# Patient Record
Sex: Male | Born: 1985 | Race: White | Hispanic: No | Marital: Single | Smoking: Former smoker
Health system: Southern US, Community
[De-identification: ages and names within clinical notes are randomized; demographics above are authoritative.]

## PROBLEM LIST (undated history)

## (undated) DIAGNOSIS — J45909 Unspecified asthma, uncomplicated: Secondary | ICD-10-CM

## (undated) DIAGNOSIS — K759 Inflammatory liver disease, unspecified: Secondary | ICD-10-CM

## (undated) DIAGNOSIS — F1990 Other psychoactive substance use, unspecified, uncomplicated: Secondary | ICD-10-CM

## (undated) DIAGNOSIS — F329 Major depressive disorder, single episode, unspecified: Secondary | ICD-10-CM

## (undated) DIAGNOSIS — F1910 Other psychoactive substance abuse, uncomplicated: Secondary | ICD-10-CM

## (undated) HISTORY — DX: Inflammatory liver disease, unspecified: K75.9

---
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
## 2006-06-20 ENCOUNTER — Emergency Department (HOSPITAL_COMMUNITY): Admission: EM | Admit: 2006-06-20 | Discharge: 2006-06-20 | Payer: Self-pay | Admitting: Emergency Medicine

## 2007-03-02 ENCOUNTER — Emergency Department (HOSPITAL_COMMUNITY): Admission: EM | Admit: 2007-03-02 | Discharge: 2007-03-02 | Payer: Self-pay | Admitting: Emergency Medicine

## 2007-03-24 ENCOUNTER — Emergency Department (HOSPITAL_COMMUNITY): Admission: EM | Admit: 2007-03-24 | Discharge: 2007-03-24 | Payer: Self-pay | Admitting: Emergency Medicine

## 2007-04-28 ENCOUNTER — Emergency Department (HOSPITAL_COMMUNITY): Admission: EM | Admit: 2007-04-28 | Discharge: 2007-04-28 | Payer: Self-pay | Admitting: Emergency Medicine

## 2008-06-13 ENCOUNTER — Encounter: Payer: Self-pay | Admitting: Pulmonary Disease

## 2008-08-22 ENCOUNTER — Ambulatory Visit: Payer: Self-pay | Admitting: Pulmonary Disease

## 2008-08-22 DIAGNOSIS — F17200 Nicotine dependence, unspecified, uncomplicated: Secondary | ICD-10-CM | POA: Insufficient documentation

## 2008-08-22 DIAGNOSIS — J209 Acute bronchitis, unspecified: Secondary | ICD-10-CM | POA: Insufficient documentation

## 2008-08-22 DIAGNOSIS — J45909 Unspecified asthma, uncomplicated: Secondary | ICD-10-CM | POA: Insufficient documentation

## 2009-04-16 ENCOUNTER — Emergency Department (HOSPITAL_COMMUNITY): Admission: EM | Admit: 2009-04-16 | Discharge: 2009-04-17 | Payer: Self-pay | Admitting: Emergency Medicine

## 2009-04-26 ENCOUNTER — Emergency Department (HOSPITAL_COMMUNITY): Admission: EM | Admit: 2009-04-26 | Discharge: 2009-04-27 | Payer: Self-pay | Admitting: Emergency Medicine

## 2009-10-06 HISTORY — PX: ORIF CLAVICLE FRACTURE: SUR924

## 2009-11-19 ENCOUNTER — Ambulatory Visit: Payer: Self-pay | Admitting: Pulmonary Disease

## 2009-11-19 DIAGNOSIS — J069 Acute upper respiratory infection, unspecified: Secondary | ICD-10-CM | POA: Insufficient documentation

## 2010-03-01 ENCOUNTER — Ambulatory Visit: Payer: Self-pay | Admitting: Psychology

## 2010-03-14 ENCOUNTER — Ambulatory Visit: Payer: Self-pay | Admitting: Psychology

## 2010-03-26 ENCOUNTER — Ambulatory Visit: Payer: Self-pay | Admitting: Psychology

## 2010-11-05 NOTE — Assessment & Plan Note (Signed)
Summary: acute sick visit for prob. URI   CC:  Pt is here for a sick visit.  Pt is Dr. Reginia [name] pt.  Pt was last seen by RA Nov 2009.  Pt c/o sore throat, non-productive cough, difficulty swallowing, and "uncomfortable feeling in chest" and increased sob at rest.  Symptoms started last night.  .  History of Present Illness: the pt is a 25y/o male who comes in today for an acute sick visit.  He has seen Dr. Vassie Loll in the distant past, but not since 2009.  He gives a one day h/o sore throat, dry cough, malaise, and some chest tightness.  He denies fevers, chills, sweats, but does have malaise/myalgias.  He has no documented history of pulmonary disease, but unfortunately does smoke  Medications Prior to Update: 1)  No Daily Medications  Allergies (verified): No Known Drug Allergies  Past History:  Past Medical History:   ? ASTHMA (ICD-493.90) - reported by pt  Past Surgical History: none per pt  Family History: Reviewed history from 08/22/2008 and no changes required. Family History Asthma--sister Family History COPD --father Family History Emphysema--PGF   Social History: Reviewed history from 08/22/2008 and no changes required. Patient is a current smoker.  1 ppd.  Patient admits to prior drug use.  Single  Student  Review of Systems       The patient complains of shortness of breath at rest, non-productive cough, difficulty swallowing, and sore throat.  The patient denies shortness of breath with activity, productive cough, coughing up blood, chest pain, irregular heartbeats, acid heartburn, indigestion, loss of appetite, weight change, abdominal pain, tooth/dental problems, headaches, nasal congestion/difficulty breathing through nose, sneezing, itching, ear ache, anxiety, depression, hand/feet swelling, joint stiffness or pain, rash, change in color of mucus, and fever.    Vital Signs:  Patient profile:   25 year old male Height:      72 inches Weight:      169 pounds BMI:      23.00 O2 Sat:      97 % on Room air Temp:     98.2 degrees F oral Pulse rate:   116 / minute BP sitting:   132 / 84  (right arm) Cuff size:   regular  Vitals Entered By: Arman Filter LPN (November 19, 2009 1:54 PM)  O2 Flow:  Room air CC: Pt is here for a sick visit.  Pt is Dr. Reginia [name] pt.  Pt was last seen by RA Nov 2009.  Pt c/o sore throat, non-productive cough, difficulty swallowing, "uncomfortable feeling in chest" and increased sob at rest.  Symptoms started last night.   Comments Medications reviewed with patient Arman Filter LPN  November 19, 2009 1:54 PM    Physical Exam  General:  wd male in nad Nose:  patent without discharge Mouth:  large tonsils, no exudates or sign of infection Neck:  no palpable LN Lungs:  totally clear to auscultation Heart:  rrr Neurologic:  alert and oriented, moves all 4.   Impression & Recommendations:  Problem # 1:  VIRAL URI (ICD-465.9) the pt's symptoms are most c/w a viral URI.  His presentation is very similar to a lot of the pts we have been seeing lately with acute viral illness that is self limiting.  I have asked him to work on symptom control with otc meds, take advil for throat discomfort.  If he is not improving in the next 48hrs, and certainly if his symptoms are worsening, he is to  call us or go to emergency room.  I have also told him it would help to stop smoking.  Other Orders: Est. Patient Level III (04540)  Patient Instructions: 1)  take advil 200mg  2-3 tabs every 6 hrs if needed for throat pain 2)  try nyquil at night, dayquil during day until better. 3)  lay off the smoking 4)  call us if worsening chest congestion or if coughing up more discolored mucus.

## 2010-12-30 ENCOUNTER — Telehealth: Payer: Self-pay | Admitting: Pulmonary Disease

## 2010-12-30 ENCOUNTER — Telehealth: Payer: Self-pay | Admitting: Internal Medicine
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
## 2010-12-30 NOTE — Telephone Encounter (Signed)
Error - duplicate msg

## 2010-12-30 NOTE — Telephone Encounter (Signed)
Pt father called back and he wanted to know if MW could see his son for his broken collar bone. I advised he could not because the pt is a Dr. Vassie Loll pt for pulmonary issues. I asked if pt had a PCP and he stated he does not. He wanted to know what ortho doctor MW recommends and will he refer the pt. I advised he could not refer him because he is not his patient.  I advised he could call [facility]. Carron Curie, CMA

## 2011-01-01 ENCOUNTER — Encounter (HOSPITAL_COMMUNITY)
Admission: RE | Admit: 2011-01-01 | Discharge: 2011-01-01 | Disposition: A | Discharge status: Home / Self Care | Payer: PRIVATE HEALTH INSURANCE | Source: Ambulatory Visit | Attending: Orthopaedic Surgery | Admitting: Orthopaedic Surgery

## 2011-01-01 LAB — CBC
HCT: 43.6 % (ref 39.0–52.0)
MCV: 88.3 fL (ref 78.0–100.0)
RDW: 14.1 % (ref 11.5–15.5)
WBC: 7.4 10*3/uL (ref 4.0–10.5)

## 2011-01-01 LAB — SURGICAL PCR SCREEN: Staphylococcus aureus: POSITIVE — AB

## 2011-01-02 ENCOUNTER — Observation Stay (HOSPITAL_COMMUNITY)
Admission: RE | Admit: 2011-01-02 | Discharge: 2011-01-03 | Disposition: A | Discharge status: Home / Self Care | Payer: PRIVATE HEALTH INSURANCE | Source: Ambulatory Visit | Attending: Orthopaedic Surgery | Admitting: Orthopaedic Surgery

## 2011-01-02 ENCOUNTER — Ambulatory Visit (HOSPITAL_COMMUNITY): Payer: PRIVATE HEALTH INSURANCE

## 2011-01-02 DIAGNOSIS — Z01818 Encounter for other preprocedural examination: Secondary | ICD-10-CM | POA: Insufficient documentation

## 2011-01-02 DIAGNOSIS — Z01812 Encounter for preprocedural laboratory examination: Secondary | ICD-10-CM | POA: Insufficient documentation

## 2011-01-02 DIAGNOSIS — S42023A Displaced fracture of shaft of unspecified clavicle, initial encounter for closed fracture: Principal | ICD-10-CM | POA: Insufficient documentation

## 2011-01-02 DIAGNOSIS — F17200 Nicotine dependence, unspecified, uncomplicated: Secondary | ICD-10-CM | POA: Insufficient documentation
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
## 2011-01-12 LAB — DIFFERENTIAL
Basophils Absolute: 0 10*3/uL (ref 0.0–0.1)
Eosinophils Absolute: 0.2 10*3/uL (ref 0.0–0.7)
Eosinophils Absolute: 0.3 10*3/uL (ref 0.0–0.7)
Eosinophils Relative: 2 % (ref 0–5)
Lymphocytes Relative: 36 % (ref 12–46)
Lymphs Abs: 2.6 10*3/uL (ref 0.7–4.0)
Monocytes Relative: 7 % (ref 3–12)
Neutrophils Relative %: 54 % (ref 43–77)

## 2011-01-12 LAB — URINALYSIS, ROUTINE W REFLEX MICROSCOPIC
Bilirubin Urine: NEGATIVE
Ketones, ur: NEGATIVE mg/dL
Nitrite: NEGATIVE
Protein, ur: NEGATIVE mg/dL

## 2011-01-12 LAB — BASIC METABOLIC PANEL
BUN: 9 mg/dL (ref 6–23)
CO2: 23 mEq/L (ref 19–32)
Chloride: 109 mEq/L (ref 96–112)
Chloride: 111 mEq/L (ref 96–112)
Creatinine, Ser: 0.98 mg/dL (ref 0.4–1.5)
GFR calc Af Amer: >60 mL/min (ref >60)
GFR calc non Af Amer: >60 mL/min (ref >60)
GFR calc non Af Amer: >60 mL/min (ref >60)
Glucose, Bld: 106 mg/dL — ABNORMAL HIGH (ref 70–99)
Potassium: 3 mEq/L — ABNORMAL LOW (ref 3.5–5.1)
Potassium: 3.8 mEq/L (ref 3.5–5.1)

## 2011-01-12 LAB — RAPID URINE DRUG SCREEN, HOSP PERFORMED
Benzodiazepines: POSITIVE — AB
Cocaine: NOT DETECTED
Opiates: NOT DETECTED
Tetrahydrocannabinol: POSITIVE — AB
Tetrahydrocannabinol: POSITIVE — AB

## 2011-01-12 LAB — CBC
HCT: 42 % (ref 39.0–52.0)
MCHC: 34.1 g/dL (ref 30.0–36.0)
MCV: 90.3 fL (ref 78.0–100.0)
MCV: 90.9 fL (ref 78.0–100.0)
Platelets: 230 10*3/uL (ref 150–400)
Platelets: 250 10*3/uL (ref 150–400)
RBC: 5.01 MIL/uL (ref 4.22–5.81)
RDW: 12.9 % (ref 11.5–15.5)
WBC: 7.4 10*3/uL (ref 4.0–10.5)

## 2011-01-12 LAB — ETHANOL: Alcohol, Ethyl (B): 109 mg/dL — ABNORMAL HIGH (ref 0–10)

## 2011-03-18 ENCOUNTER — Emergency Department (HOSPITAL_COMMUNITY): Payer: PRIVATE HEALTH INSURANCE

## 2011-03-18 ENCOUNTER — Emergency Department (HOSPITAL_COMMUNITY)
Admission: EM | Admit: 2011-03-18 | Discharge: 2011-03-18 | Disposition: A | Discharge status: Home / Self Care | Payer: PRIVATE HEALTH INSURANCE | Attending: Emergency Medicine | Admitting: Emergency Medicine

## 2011-03-18 DIAGNOSIS — F1010 Alcohol abuse, uncomplicated: Secondary | ICD-10-CM | POA: Insufficient documentation

## 2011-03-18 DIAGNOSIS — R4182 Altered mental status, unspecified: Secondary | ICD-10-CM | POA: Insufficient documentation

## 2011-03-18 DIAGNOSIS — T50901A Poisoning by unspecified drugs, medicaments and biological substances, accidental (unintentional), initial encounter: Secondary | ICD-10-CM | POA: Insufficient documentation

## 2011-03-18 DIAGNOSIS — F17200 Nicotine dependence, unspecified, uncomplicated: Secondary | ICD-10-CM | POA: Insufficient documentation

## 2011-03-18 DIAGNOSIS — R61 Generalized hyperhidrosis: Secondary | ICD-10-CM | POA: Insufficient documentation

## 2011-03-18 DIAGNOSIS — R404 Transient alteration of awareness: Secondary | ICD-10-CM | POA: Insufficient documentation

## 2011-03-18 DIAGNOSIS — F1910 Other psychoactive substance abuse, uncomplicated: Secondary | ICD-10-CM | POA: Insufficient documentation

## 2011-03-18 LAB — RAPID URINE DRUG SCREEN, HOSP PERFORMED
Amphetamines: POSITIVE — AB
Opiates: POSITIVE — AB
Tetrahydrocannabinol: POSITIVE — AB

## 2011-03-18 LAB — COMPREHENSIVE METABOLIC PANEL
ALT: 22 U/L (ref 0–53)
BUN: 8 mg/dL (ref 6–23)
CO2: 22 mEq/L (ref 19–32)
Calcium: 9 mg/dL (ref 8.4–10.5)
GFR calc Af Amer: >60 mL/min (ref >60)
GFR calc non Af Amer: >60 mL/min (ref >60)
Glucose, Bld: 221 mg/dL — ABNORMAL HIGH (ref 70–99)
Sodium: 138 mEq/L (ref 135–145)
Total Protein: 7.7 g/dL (ref 6.0–8.3)

## 2011-03-18 LAB — URINALYSIS, ROUTINE W REFLEX MICROSCOPIC
Bilirubin Urine: NEGATIVE
Hgb urine dipstick: NEGATIVE
Nitrite: NEGATIVE
Protein, ur: NEGATIVE mg/dL
Specific Gravity, Urine: 1.012 (ref 1.005–1.030)
Urobilinogen, UA: 0.2 mg/dL (ref 0.0–1.0)

## 2011-03-18 LAB — CBC
HCT: 47.2 % (ref 39.0–52.0)
Hemoglobin: 16.6 g/dL (ref 13.0–17.0)
MCH: 31.1 pg (ref 26.0–34.0)
MCHC: 35.2 g/dL (ref 30.0–36.0)
MCV: 88.4 fL (ref 78.0–100.0)
RBC: 5.34 MIL/uL (ref 4.22–5.81)

## 2011-03-18 LAB — DIFFERENTIAL
Basophils Relative: 0 % (ref 0–1)
Lymphocytes Relative: 39 % (ref 12–46)
Lymphs Abs: 4.9 10*3/uL — ABNORMAL HIGH (ref 0.7–4.0)
Monocytes Absolute: 0.7 10*3/uL (ref 0.1–1.0)
Monocytes Relative: 6 % (ref 3–12)
Neutro Abs: 6.9 10*3/uL (ref 1.7–7.7)
Neutrophils Relative %: 55 % (ref 43–77)

## 2011-03-18 LAB — ETHANOL: Alcohol, Ethyl (B): 158 mg/dL — ABNORMAL HIGH (ref 0–10)

## 2011-03-19 LAB — GLUCOSE, CAPILLARY: Glucose-Capillary: 210 mg/dL — ABNORMAL HIGH (ref 70–99)

## 2011-07-23 LAB — RAPID URINE DRUG SCREEN, HOSP PERFORMED
Cocaine: NOT DETECTED
Opiates: NOT DETECTED
Tetrahydrocannabinol: POSITIVE — AB

## 2011-07-23 LAB — ETHANOL: Alcohol, Ethyl (B): 199 — ABNORMAL HIGH

## 2011-10-12 ENCOUNTER — Emergency Department (HOSPITAL_COMMUNITY)
Admission: EM | Admit: 2011-10-12 | Discharge: 2011-10-12 | Disposition: A | Discharge status: Home / Self Care | Payer: Self-pay | Attending: Emergency Medicine | Admitting: Emergency Medicine

## 2011-10-12 DIAGNOSIS — F411 Generalized anxiety disorder: Secondary | ICD-10-CM | POA: Insufficient documentation

## 2011-10-12 DIAGNOSIS — F329 Major depressive disorder, single episode, unspecified: Secondary | ICD-10-CM | POA: Insufficient documentation

## 2011-10-12 DIAGNOSIS — F3289 Other specified depressive episodes: Secondary | ICD-10-CM | POA: Insufficient documentation

## 2011-10-12 DIAGNOSIS — F1910 Other psychoactive substance abuse, uncomplicated: Secondary | ICD-10-CM | POA: Insufficient documentation

## 2011-10-12 LAB — COMPREHENSIVE METABOLIC PANEL
ALT: 19 U/L (ref 0–53)
AST: 17 U/L (ref 0–37)
Albumin: 4.4 g/dL (ref 3.5–5.2)
Alkaline Phosphatase: 65 U/L (ref 39–117)
BUN: 13 mg/dL (ref 6–23)
CO2: 25 mEq/L (ref 19–32)
Calcium: 9.6 mg/dL (ref 8.4–10.5)
Chloride: 99 mEq/L (ref 96–112)
Creatinine, Ser: 0.9 mg/dL (ref 0.50–1.35)
GFR calc Af Amer: >90 mL/min (ref >90)
GFR calc non Af Amer: >90 mL/min (ref >90)
Glucose, Bld: 96 mg/dL (ref 70–99)
Potassium: 3.8 mEq/L (ref 3.5–5.1)
Sodium: 136 mEq/L (ref 135–145)
Total Bilirubin: 0.4 mg/dL (ref 0.3–1.2)
Total Protein: 7.4 g/dL (ref 6.0–8.3)

## 2011-10-12 LAB — CBC
HCT: 42.3 % (ref 39.0–52.0)
Hemoglobin: 14.5 g/dL (ref 13.0–17.0)
MCH: 30.9 pg (ref 26.0–34.0)
MCHC: 34.3 g/dL (ref 30.0–36.0)
MCV: 90.2 fL (ref 78.0–100.0)
Platelets: 262 10*3/uL (ref 150–400)
RBC: 4.69 MIL/uL (ref 4.22–5.81)
RDW: 13.5 % (ref 11.5–15.5)
WBC: 9 10*3/uL (ref 4.0–10.5)

## 2011-10-12 LAB — RAPID URINE DRUG SCREEN, HOSP PERFORMED
Amphetamines: NOT DETECTED
Barbiturates: NOT DETECTED
Benzodiazepines: NOT DETECTED
Cocaine: NOT DETECTED
Opiates: POSITIVE — AB
Tetrahydrocannabinol: POSITIVE — AB

## 2011-10-12 LAB — ETHANOL: Alcohol, Ethyl (B): <11 mg/dL (ref 0–11)

## 2011-10-12 LAB — ACETAMINOPHEN LEVEL: Acetaminophen (Tylenol), Serum: <15 ug/mL (ref 10–30)

## 2011-10-12 MED ORDER — LORAZEPAM 1 MG PO TABS
1.0000 mg | ORAL_TABLET | Freq: Once | ORAL | Status: AC
  Administered 2011-10-12: 1 mg via ORAL
  Filled 2011-10-12: qty 1

## 2011-10-12 NOTE — ED Notes (Signed)
Pt in with assessment team request detox from heroin pt has a bed available with [facility] just needs med clearance denies h/i denies s/i denies pain or discomfort

## 2011-10-12 NOTE — ED Provider Notes (Signed)
History     CSN: [id number]  Arrival date & time 10/12/11  1536   First MD Initiated Contact with Patient 10/12/11 1653      Chief Complaint  Patient presents with  . V70.1    (Consider location/radiation/quality/duration/timing/severity/associated sxs/prior treatment) Patient is a 26 y.o. male presenting with drug/alcohol assessment. The history is provided by the patient.  Drug / Alcohol Assessment  Pt states he wants detox from alcohol and heroin. States he has been doing it for a year and a half. Tried to stop on his own, and gets sick. Drinks every day. Last used and drank alcohol was yesterday. Denies SI or HI. States uses because of depression and anxiety. Pt was already assessed at [facility], has a bed, needs to be medically cleared. Pt denies medical problems. Denies any complaints.   History reviewed. No pertinent past medical history.  History reviewed. No pertinent past surgical history.  No family history on file.  History  Substance Use Topics  . Smoking status: Current Everyday Smoker  . Smokeless tobacco: Not on file  . Alcohol Use: Yes      Review of Systems  Constitutional: Negative.   HENT: Negative.   Eyes: Negative.   Respiratory: Negative.   Cardiovascular: Negative.   Gastrointestinal: Negative.   Genitourinary: Negative.   Musculoskeletal: Negative.   Neurological:       Shakiness  Psychiatric/Behavioral:       Anxiety, depression    Allergies  Review of patient's allergies indicates no known allergies.  Home Medications  No current outpatient prescriptions on file.  Pulse 72  Temp(Src) 98.6 F (37 C) (Oral)  Resp 23  SpO2 100%  Physical Exam  Nursing note and vitals reviewed. Constitutional: He is oriented to person, place, and time. He appears well-developed and well-nourished. No distress.  HENT:  Head: Normocephalic and atraumatic.  Eyes: Conjunctivae are normal.  Neck: Neck supple.  Cardiovascular: Normal rate, regular rhythm  and normal heart sounds.   Pulmonary/Chest: Effort normal and breath sounds normal. No respiratory distress. He has no wheezes.  Abdominal: Soft. Bowel sounds are normal. There is no tenderness.  Musculoskeletal: Normal range of motion.  Neurological: He is alert and oriented to person, place, and time.       Intentional tremor noted  Skin: Skin is warm and dry.  Psychiatric: Judgment and thought content normal.       Flat affect, appears anxious    ED Course  Procedures (including critical care time)  Pt already with bed at [facility]. Pt here for medical clearance. Labs and UA pending. Results for orders placed during the hospital encounter of 10/12/11  CBC      Component Value Range   WBC 9.0  4.0 - 10.5 (K/uL)   RBC 4.69  4.22 - 5.81 (MIL/uL)   Hemoglobin 14.5  13.0 - 17.0 (g/dL)   HCT 91.4  78.2 - 95.6 (%)   MCV 90.2  78.0 - 100.0 (fL)   MCH 30.9  26.0 - 34.0 (pg)   MCHC 34.3  30.0 - 36.0 (g/dL)   RDW 21.3  08.6 - 57.8 (%)   Platelets 262  150 - 400 (K/uL)  COMPREHENSIVE METABOLIC PANEL      Component Value Range   Sodium 136  135 - 145 (mEq/L)   Potassium 3.8  3.5 - 5.1 (mEq/L)   Chloride 99  96 - 112 (mEq/L)   CO2 25  19 - 32 (mEq/L)   Glucose, Bld 96  70 -  99 (mg/dL)   BUN 13  6 - 23 (mg/dL)   Creatinine, Ser 1.61  0.50 - 1.35 (mg/dL)   Calcium 9.6  8.4 - 09.6 (mg/dL)   Total Protein 7.4  6.0 - 8.3 (g/dL)   Albumin 4.4  3.5 - 5.2 (g/dL)   AST 17  0 - 37 (U/L)   ALT 19  0 - 53 (U/L)   Alkaline Phosphatase 65  39 - 117 (U/L)   Total Bilirubin 0.4  0.3 - 1.2 (mg/dL)   GFR calc non Af Amer >90  >90 (mL/min)   GFR calc Af Amer >90  >90 (mL/min)  ETHANOL      Component Value Range   Alcohol, Ethyl (B) <11  0 - 11 (mg/dL)  ACETAMINOPHEN LEVEL      Component Value Range   Acetaminophen (Tylenol), Serum <15.0  10 - 30 (ug/mL)  URINE RAPID DRUG SCREEN (HOSP PERFORMED)      Component Value Range   Opiates POSITIVE (*) NONE DETECTED    Cocaine NONE DETECTED  NONE DETECTED      Benzodiazepines NONE DETECTED  NONE DETECTED    Amphetamines NONE DETECTED  NONE DETECTED    Tetrahydrocannabinol POSITIVE (*) NONE DETECTED    Barbiturates NONE DETECTED  NONE DETECTED    No results found.   Pt medically cleared, d/c to [facility] by private vehicle. Pt's crisis center assistant with him.   No diagnosis found.  BP not documented by a nurse, i checked it myself it is 138/78  MDM         Lottie Mussel, PA 10/13/11 0128

## 2011-10-14 NOTE — ED Provider Notes (Signed)
Medical screening examination/treatment/procedure(s) were performed by non-physician practitioner and as supervising physician I was immediately available for consultation/collaboration.  Anniemae Haberkorn, MD 10/14/11 0731 

## 2012-06-30 ENCOUNTER — Emergency Department (HOSPITAL_COMMUNITY)
Admission: EM | Admit: 2012-06-30 | Discharge: 2012-07-01 | Disposition: A | Discharge status: Home / Self Care | Payer: Self-pay | Attending: Emergency Medicine | Admitting: Emergency Medicine

## 2012-06-30 ENCOUNTER — Encounter (HOSPITAL_COMMUNITY): Payer: Self-pay | Admitting: Emergency Medicine

## 2012-06-30 DIAGNOSIS — F1110 Opioid abuse, uncomplicated: Secondary | ICD-10-CM | POA: Insufficient documentation

## 2012-06-30 DIAGNOSIS — F1910 Other psychoactive substance abuse, uncomplicated: Secondary | ICD-10-CM

## 2012-06-30 DIAGNOSIS — F17200 Nicotine dependence, unspecified, uncomplicated: Secondary | ICD-10-CM | POA: Insufficient documentation

## 2012-06-30 LAB — COMPREHENSIVE METABOLIC PANEL
Alkaline Phosphatase: 67 U/L (ref 39–117)
BUN: 10 mg/dL (ref 6–23)
CO2: 23 mEq/L (ref 19–32)
Chloride: 103 mEq/L (ref 96–112)
Creatinine, Ser: 0.97 mg/dL (ref 0.50–1.35)
GFR calc Af Amer: >90 mL/min (ref >90)
GFR calc non Af Amer: >90 mL/min (ref >90)
Glucose, Bld: 105 mg/dL — ABNORMAL HIGH (ref 70–99)
Potassium: 4.1 mEq/L (ref 3.5–5.1)
Total Bilirubin: 0.2 mg/dL — ABNORMAL LOW (ref 0.3–1.2)

## 2012-06-30 LAB — CBC
HCT: 43.8 % (ref 39.0–52.0)
Hemoglobin: 15 g/dL (ref 13.0–17.0)
MCV: 88.3 fL (ref 78.0–100.0)
WBC: 7.7 10*3/uL (ref 4.0–10.5)

## 2012-06-30 LAB — RAPID URINE DRUG SCREEN, HOSP PERFORMED
Amphetamines: NOT DETECTED
Opiates: POSITIVE — AB

## 2012-06-30 LAB — ETHANOL: Alcohol, Ethyl (B): 42 mg/dL — ABNORMAL HIGH (ref 0–11)

## 2012-06-30 MED ORDER — ONDANSETRON 4 MG PO TBDP
4.0000 mg | ORAL_TABLET | Freq: Four times a day (QID) | ORAL | Status: DC | PRN
  Filled 2012-06-30: qty 1

## 2012-06-30 MED ORDER — CLONIDINE HCL 0.1 MG PO TABS
0.1000 mg | ORAL_TABLET | Freq: Four times a day (QID) | ORAL | Status: DC
  Administered 2012-06-30 (×2): 0.1 mg via ORAL
  Filled 2012-06-30 (×2): qty 1

## 2012-06-30 MED ORDER — ALUM & MAG HYDROXIDE-SIMETH 200-200-20 MG/5ML PO SUSP: 30.0000 mL | ORAL | Status: DC | PRN

## 2012-06-30 MED ORDER — DICYCLOMINE HCL 20 MG PO TABS: 20.0000 mg | ORAL_TABLET | Freq: Four times a day (QID) | ORAL | Status: DC | PRN

## 2012-06-30 MED ORDER — LOPERAMIDE HCL 2 MG PO CAPS: 2.0000 mg | ORAL_CAPSULE | ORAL | Status: DC | PRN

## 2012-06-30 MED ORDER — HYDROXYZINE HCL 25 MG PO TABS
25.0000 mg | ORAL_TABLET | Freq: Four times a day (QID) | ORAL | Status: DC | PRN
  Administered 2012-06-30: 25 mg via ORAL
  Filled 2012-06-30: qty 1

## 2012-06-30 MED ORDER — NICOTINE 21 MG/24HR TD PT24
21.0000 mg | MEDICATED_PATCH | Freq: Every day | TRANSDERMAL | Status: DC
  Administered 2012-06-30: 21 mg via TRANSDERMAL
  Filled 2012-06-30: qty 1

## 2012-06-30 MED ORDER — CLONIDINE HCL 0.1 MG PO TABS: 0.1000 mg | ORAL_TABLET | ORAL | Status: DC

## 2012-06-30 MED ORDER — LORAZEPAM 1 MG PO TABS: 1.0000 mg | ORAL_TABLET | Freq: Three times a day (TID) | ORAL | Status: DC | PRN

## 2012-06-30 MED ORDER — METHOCARBAMOL 500 MG PO TABS: 500.0000 mg | ORAL_TABLET | Freq: Three times a day (TID) | ORAL | Status: DC | PRN

## 2012-06-30 MED ORDER — NAPROXEN 500 MG PO TABS
500.0000 mg | ORAL_TABLET | Freq: Two times a day (BID) | ORAL | Status: DC | PRN
  Filled 2012-06-30: qty 1

## 2012-06-30 MED ORDER — CLONIDINE HCL 0.1 MG PO TABS: 0.1000 mg | ORAL_TABLET | Freq: Every day | ORAL | Status: DC

## 2012-06-30 NOTE — ED Provider Notes (Signed)
Medical screening examination/treatment/procedure(s) were conducted as a shared visit with non-physician practitioner(s) and myself.  I personally evaluated the patient during the encounter.  Wants detox from heroin. Also uses marijuana, alcohol, benzodiazepines. ACT consult  Donnetta Hutching, MD 06/30/12 [phone]

## 2012-06-30 NOTE — ED Notes (Signed)
Pt requesting detox from heroin, alcohol, pot, and benzos.  Pt last used heroin at around 0800 this morning.  Was drinking at that time.  Last time smoked marijuana was last night.  Last time using benzos was yesterday around 1000.
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
## 2012-06-30 NOTE — ED Notes (Signed)
Pt given peanut butter and crackers per request.  ?

## 2012-06-30 NOTE — ED Provider Notes (Signed)
History     CSN: [id number]  Arrival date & time 06/30/12  1345   First MD Initiated Contact with Patient 06/30/12 1504      Chief Complaint  Patient presents with  . Medical Clearance    (Consider location/radiation/quality/duration/timing/severity/associated sxs/prior treatment) HPI Comments: Patient presents requesting detox from opiates specifically heroin. Patient states he's been using approximately $60-$80 of heroin per day for the past 3 years. He also uses alcohol and benzodiazepines on a weekly, but not daily basis. Last heroin use was this morning. Last benzodiazepine and alcohol use was yesterday. Patient denies suicidal or homicidal ideations. Patient states he currently feels "jittery" but denies other acute medical complaints. Onset is gradual. Course is constant. Nothing makes symptoms better or worse.  The history is provided by the patient.    History reviewed. No pertinent past medical history.  History reviewed. No pertinent past surgical history.  History reviewed. No pertinent family history.  History  Substance Use Topics  . Smoking status: Current Every Day Smoker -- 1.0 packs/day  . Smokeless tobacco: Not on file  . Alcohol Use: Yes      Review of Systems  Constitutional: Negative for fever.  HENT: Negative for sore throat and rhinorrhea.   Eyes: Negative for redness.  Respiratory: Negative for cough.   Cardiovascular: Negative for chest pain.  Gastrointestinal: Negative for nausea, vomiting, abdominal pain and diarrhea.  Genitourinary: Negative for dysuria.  Musculoskeletal: Negative for myalgias.  Skin: Negative for rash.  Neurological: Negative for headaches.  Psychiatric/Behavioral: The patient is nervous/anxious.     Allergies  Review of patient's allergies indicates no known allergies.  Home Medications  No current outpatient prescriptions on file.  BP 125/73  Pulse 98  Temp 97.8 F (36.6 C) (Oral)  Resp 18  SpO2  96%  Physical Exam  Nursing note and vitals reviewed. Constitutional: He appears well-developed and well-nourished.  HENT:  Head: Normocephalic and atraumatic.  Eyes: Conjunctivae normal are normal. Right eye exhibits no discharge. Left eye exhibits no discharge.  Neck: Normal range of motion. Neck supple.  Cardiovascular: Normal rate, regular rhythm and normal heart sounds.   Pulmonary/Chest: Effort normal and breath sounds normal.  Abdominal: Soft. There is no tenderness.  Neurological: He is alert.  Skin: Skin is warm and dry.  Psychiatric: He has a normal mood and affect.    ED Course  Procedures (including critical care time)  Labs Reviewed  COMPREHENSIVE METABOLIC PANEL - Abnormal; Notable for the following:    Glucose, Bld 105 (*)     Total Bilirubin 0.2 (*)     All other components within normal limits  ETHANOL - Abnormal; Notable for the following:    Alcohol, Ethyl (B) 42 (*)     All other components within normal limits  CBC  URINE RAPID DRUG SCREEN (HOSP PERFORMED)   No results found.   1. Heroin abuse   2. Polysubstance abuse     3:44 PM Patient seen and examined. Work-up initiated. Holding orders completed.   Vital signs reviewed and are as follows: Filed Vitals:   06/30/12 1430  BP: 125/73  Pulse: 98  Temp: 97.8 F (36.6 C)  Resp: 18   5:07 PM Patient d/w Dr. Adriana Simas. Patient medically cleared. ACT informed and will see patient. Holding orders and clonidine protocol started.    MDM  Pending ACT eval.         Renne Crigler, PA 06/30/12 1708

## 2012-06-30 NOTE — ED Notes (Signed)
Pt unable to void 

## 2012-06-30 NOTE — ED Notes (Signed)
Gave pt juice and water to  drink

## 2012-07-01 NOTE — Discharge Instructions (Signed)
 Substance Abuse Your exam indicates that you have a problem with substance abuse. Substance abuse is the misuse of alcohol or drugs that causes problems in family life, friendships, and work relationships. Substance abuse is the most important cause of premature illness, disability, and death in our society. It is also the greatest threat to a person's mental and spiritual well being. Substance abuse can start out in an innocent way, such as social drinking or taking a little extra medication prescribed by your doctor. No one starts out with the intention of becoming an alcoholic or an addict. Substance abuse victims cannot control their use of alcohol or drugs. They may become intoxicated daily or go on weekend binges. Often there is a strong desire to quit, but attempts to stop using often fail. Encounters with law enforcement or conflicts with family members, friends, and work associates are signs of a potential problem. Recovery is always possible, although the craving for some drugs makes it difficult to quit without assistance. Many treatment programs are available to help people stop abusing alcohol or drugs. The first step in treatment is to admit you have a problem. This is a major hurdle because denial is a powerful force with substance abuse. Alcoholics Anonymous, Narcotics Anonymous, Cocaine Anonymous, and other recovery groups and programs can be very useful in helping people to quit. If you do not feel okay about your drug or alcohol use and if it is causing you trouble, we want to encourage you to talk about it with your doctor or with someone from a recovery group who can help you. You could also call the [employer] on Drug Abuse at 1-800-662-HELP. It is up to you to take the first step. AL-ANON and ALA-TEEN are support groups for friends and family members of an alcohol or drug dependent person. The people who love and care for the alcoholic or addicted person often need help, too. For  information about these organizations, check your phone directory or call a local alcohol or drug treatment center. Document Released: 10/30/2004 Document Revised: 09/11/2011 Document Reviewed: 09/23/2008 [facility] Patient Information 2012 Bryant, [place] [name].    RESOURCE GUIDE  Chronic Pain Problems: Contact [name]  [phone] Patients need to be referred by their primary care doctor.  Insufficient Money for Medicine: Contact United Way:  call 211 or [employer] [phone].  No Primary Care Doctor: - Call Health Connect  [phone] - can help you locate a primary care doctor that  accepts your insurance, provides certain [name], etc. - Physician Referral [phone]  Agencies that provide inexpensive medical care: - [name]  [phone] - [name]  [phone] - [facility]  [phone] [facility]  [phone] - Planned Parenthood  [phone] [facility]  [phone]  Medicaid-accepting [facility] Providers: - [name]- [address] [name], [address]  [phone], Mon-Fri 9am-7pm, Sat 9am-1pm - [facility]- [address] [place], [address] [place]  [phone] - [facility]- [address] [place]  [phone] [facility]- [address]  [phone] - [name]- [address] [place], [address], [phone]  Only accepts [place] patients after they have their name  applied to their card  Self Pay (no insurance) in [place]: - Sickle Cell Patients: [name], [facility]  [address] [place], [phone] - [facility]  [facility]- [address] [place]  [phone]       [place] [facility] [place]- [address]       -     [facility]- see information above (Speak to [employer] if you do not have insurance)       -  [facility]- [address] [place], [phone]       -  [facility]- [address],  [phone]       -  [facility]- [address] [phone]       -  [place]on [name]-  [address], [place], [phone]       -  [facility]- [address], [phone]       -  [facility]- [address] [phone], also [address] [phone]       -    [facility]- [address] [place], [phone], 1st & 3rd Saturday   every month, 10am-1pm  1) Find a Doctor and Pay Out of Pocket Although you won't have to find out who is covered by your insurance plan, it is a good idea to ask around and get recommendations. You will then need to call the office and see if the doctor you have chosen will accept you as a new patient and what types of options they offer for patients who are self-pay. Some doctors offer discounts or will set up payment plans for their patients who do not have insurance, but you will need to ask so you aren't surprised when you get to your appointment.  2) Contact Your Local Health Department Not all health departments have doctors that can see patients for sick visits, but many do, so it is worth a call to see if yours does. If you don't know where your local health department is, you can check in your phone book. The CDC also has a tool to help you locate your state's health department, and many state websites also have listings of all of their local health departments.  3) Find a Walk-in Clinic If your illness is not likely to be very severe or complicated, you may want to try a walk in clinic. These are popping up all over the country in pharmacies, drugstores, and shopping centers. They're usually staffed by nurse practitioners or physician assistants that have been trained to treat common illnesses and complaints. They're usually fairly quick and inexpensive. However, if you have serious medical issues or chronic medical problems,  these are probably not your best option  STD Testing - [facility] Department of [facility], STD Clinic, [address], [place], phone [phone] or [phone].  Monday - Friday, call for an appointment. [facility] Department of [employer], STD Clinic, [address] [address] [place], [place], phone [phone] or [phone].  Monday - Friday, call for an appointment.  Abuse/Neglect: [facility] Child Abuse Hotline 6804076388 [facility] Child Abuse Hotline (647) 684-7516 (After Hours)  Emergency Shelter:  [place] [name] (930) [phone]  Maternity Homes: - Room at the [place] [facility] [phone] - [name] [phone]  MRSA Hotline #:   401 621 5607  [facility] Resources  [facility] [place]  United Way [facility] Dept. [address]  [address]         [address] [place] [address]  [name] Phone:  [phone]                                  Phone:  [phone]                   Phone:  [phone]  [facility], [phone] - [facility] - [phone]       -     [facility] in [place], [address]                                  [phone], [facility] Child Abuse Hotline 365-342-4418 or (239)599-8896 (After Hours)   Behavioral Health [name]  Substance Abuse Resources: - Alcohol and Drug [name]  [phone] - [facility] [phone] - [facility] [place] [phone] [place] [phone] - Residential & Outpatient Substance Abuse Program  [phone]  Psychological [name]: [name]  [phone] [name]  [phone] - [facility], [phone] [place]. [address] [place],  ACCESS LINE: (605)853-3588 or 613-473-9418, [email or web address]  Dental Assistance  If unable to pay or uninsured, contact:  [facility] or [facility]. to become qualified for the adult dental clinic.  Patients with Medicaid: [facility] [phone], [phone] [address] [place] [phone]  If unable to pay, or uninsured, contact [facility] [phone]) or [facility] Department [phone] in [place], [phone] in [facility]) to become qualified for the adult dental clinic  Other Low-Cost Community Dental [name]: - [facility]- [address] [place], [place], [place], [address], [phone], [address]. [phone], 2nd and 4th Thursday of the month at 6:30am.  10 clients each day by appointment, can sometimes see walk-in patients if someone does not show for an appointment. [facility]- [address] [place], [place], [address], [phone] - [facility]- [address], [place], [place], [address], [phone] - [place]- [phone] [facility] Department- [phone] [facility] Department- [phone]

## 2012-07-01 NOTE — ED Provider Notes (Signed)
Filed Vitals:   07/01/12 0603  BP: 90/57  Pulse: 105  Temp: 98.5 F (36.9 C)  Resp:    Pt is in no distress.  No psych symptoms,accepted at [facility] for detox and substance abuse.  Pt has transportation there.  Will dc.  Gavin Pound. Oletta Lamas, MD 07/01/12 1610

## 2012-07-01 NOTE — BHH Counselor (Signed)
Pt has been accepted at [facility]. Pt notified and agrees with plan. Pt has his own transportation that can take him at 9AM. On coming staff will need to call [facility] [phone] when to notify them when pt leaves hospital.

## 2012-07-01 NOTE — ED Notes (Signed)
Per Stephaine with [facility], states pt has been accepted at [facility], states his ride can pick him up at 9am, states RN needs to call [facility] when pt leaves here. Pt verbalized plan of care. Pt a/o, no distress noted.

## 2012-07-01 NOTE — ED Notes (Signed)
Pt awake, states he can call friend for ride to [facility]. Spoke w/ RN at [facility] who states pt must be at [facility] w/i 2hrs of d/c from ED. Pt will need own cigarettes and 3 days worth of clothing. Pt informed of above and has called friend, Weyman Croon, for ride. Await ride.

## 2012-07-01 NOTE — BH Assessment (Signed)
Assessment Note   Devon [name] is an 26 y.o. male.Pt requests detox from heroin and alcohol.  Pt denies use of other substances but UDS positive for benzos, marijuana as well.  Pt reports daily use of both heroin and alcohol with significant withdrawal symptoms.  Pt reports depression due to drug use/relatioinship problems but denies SI/HI/AV.   Axis I: opioide dependence, alcohol dependence Axis II: Deferred Axis III: History reviewed. No pertinent past medical history. Axis IV: problems with primary support group Axis V: 41-50 serious symptoms  Past Medical History: History reviewed. No pertinent past medical history.  History reviewed. No pertinent past surgical history.  Family History: History reviewed. No pertinent family history.  Social History:  reports that he has been smoking.  He does not have any smokeless tobacco history on file. He reports that he drinks alcohol. He reports that he uses illicit drugs (Marijuana).  Additional Social History:  Alcohol / Drug Use Pain Medications: Pt denies Prescriptions: Pt denies Over the Counter: Pt denies History of alcohol / drug use?: Yes Substance #1 Name of Substance 1: heroin 1 - Age of First Use: 21 1 - Amount (size/oz): 6-7 bags 1 - Frequency: daily 1 - Duration: 4 months 1 - Last Use / Amount: 9/25, 2 bags Substance #2 Name of Substance 2: alcohol 2 - Age of First Use: 15 2 - Amount (size/oz): 12 pack 2 - Frequency: daily 2 - Duration: 4 months 2 - Last Use / Amount: 9/25, 1/2 fifth vodka  CIWA: CIWA-Ar BP: 125/75 mmHg Pulse Rate: 93  Nausea and Vomiting: 3 Tactile Disturbances: none Tremor: no tremor Auditory Disturbances: not present Paroxysmal Sweats: three Visual Disturbances: not present Anxiety: two Headache, Fullness in Head: none present Agitation: normal activity Orientation and Clouding of Sensorium: oriented and can do serial additions CIWA-Ar Total: 8  COWS: Clinical Opiate Withdrawal Scale  (COWS) Resting Pulse Rate: Pulse Rate 81-100 Sweating: Flushed or Observable moistness on face Restlessness: Able to sit still Pupil Size: Pupils possibly larger than normal for room light Bone or Joint Aches: Patient reports sever diffuse aching of joints/muscles Runny Nose or Tearing: Nasal stuffiness or unusually moist eyes GI Upset: nausea or loose stool Tremor: No tremor Yawning: No yawning Anxiety or Irritability: None Gooseflesh Skin: Skin is smooth COWS Total Score: 9   Allergies: No Known Allergies  Home Medications:  (Not in a hospital admission)  OB/GYN Status:  No LMP for male patient.  General Assessment Data [facility] Assessment: Yes Living Arrangements: Parent Can pt return to current living arrangement?: Yes Admission Status: Voluntary     Risk to self Suicidal Ideation: No Suicidal Intent: No Is patient at risk for suicide?: No Suicidal Plan?: No Access to Means: No What has been your use of drugs/alcohol within the last 12 months?: current use of heroin and alcohol Previous Attempts/Gestures: No Intentional Self Injurious Behavior: None Family Suicide History: No Recent stressful life event(s): Other (Comment) (drug use/relationship problems) Persecutory voices/beliefs?: No Depression: Yes Depression Symptoms: Despondent;Isolating;Fatigue;Guilt;Loss of interest in usual pleasures;Feeling worthless/self pity;Feeling angry/irritable Substance abuse history and/or treatment for substance abuse?: Yes Suicide prevention information given to non-admitted patients: Not applicable  Risk to Others Homicidal Ideation: No Thoughts of Harm to Others: No Current Homicidal Intent: No Current Homicidal Plan: No Access to Homicidal Means: No History of harm to others?: No Assessment of Violence: None Noted Does patient have access to weapons?: No Criminal Charges Pending?: No Does patient have a court  date:  No  Psychosis Hallucinations: None noted Delusions: None noted  Mental Status Report Appear/Hygiene: Disheveled Eye Contact: Good Motor Activity: Unremarkable Speech: Logical/coherent Level of Consciousness: Alert Mood: Other (Comment) (cooperative) Affect: Appropriate to circumstance Anxiety Level: None Thought Processes: Coherent;Relevant Judgement: Unimpaired Orientation: Person;Place;Time;Situation Obsessive Compulsive Thoughts/Behaviors: None  Cognitive Functioning Concentration: Normal Memory: Recent Intact;Remote Intact IQ: Average Insight: Good Impulse Control: Poor Appetite: Good Weight Loss: 0  Weight Gain: 10  Sleep: Decreased Total Hours of Sleep: 6  Vegetative Symptoms: None  ADLScreening [facility] Assessment Services) Patient's cognitive ability adequate to safely complete daily activities?: Yes Patient able to express need for assistance with ADLs?: Yes Independently performs ADLs?: Yes (appropriate for developmental age)  Abuse/Neglect [facility]) Physical Abuse: Denies Verbal Abuse: Denies Sexual Abuse: Denies  Prior Inpatient Therapy Prior Inpatient Therapy: Yes Prior Therapy Dates: 01/2012 Prior Therapy Facilty/Provider(s): galax Reason for Treatment: substance abuse  Prior Outpatient Therapy Prior Outpatient Therapy: No  ADL Screening (condition at time of admission) Patient's cognitive ability adequate to safely complete daily activities?: Yes Patient able to express need for assistance with ADLs?: Yes Independently performs ADLs?: Yes (appropriate for developmental age) Weakness of Legs: None Weakness of Arms/Hands: None  Home Assistive Devices/Equipment Home Assistive Devices/Equipment: None    Abuse/Neglect Assessment (Assessment to be complete while patient is alone) Physical Abuse: Denies Verbal Abuse: Denies Sexual Abuse: Denies Exploitation of patient/patient's resources: Denies Self-Neglect: Denies     Merchant navy officer (For  Healthcare) Advance Directive: Patient does not have advance directive;Patient would not like information    Additional Information 1:1 In Past 12 Months?: No CIRT Risk: No Elopement Risk: No Does patient have medical clearance?: Yes     Disposition: Pt referred to [facility] for detox. Disposition Disposition of Patient: Referred to Patient referred to: [facility]  On Site Evaluation by:   Reviewed with Physician:     Lorri Frederick 07/01/2012 12:11 AM

## 2012-07-01 NOTE — ED Notes (Signed)
[facility] called and notified that pt has just been d/c.

## 2012-07-01 NOTE — ED Notes (Signed)
Pt father is here for ride to [facility]. D/C instructions reviewed w/ pt. Pt refusing Engineer, mining and log-in. States "I don't need that now."

## 2012-07-17 ENCOUNTER — Encounter (HOSPITAL_COMMUNITY): Payer: Self-pay | Admitting: Emergency Medicine

## 2012-07-17 ENCOUNTER — Emergency Department (HOSPITAL_COMMUNITY)
Admission: EM | Admit: 2012-07-17 | Discharge: 2012-07-18 | Disposition: A | Discharge status: Home / Self Care | Payer: Self-pay | Attending: Emergency Medicine | Admitting: Emergency Medicine

## 2012-07-17 DIAGNOSIS — F1210 Cannabis abuse, uncomplicated: Secondary | ICD-10-CM | POA: Insufficient documentation

## 2012-07-17 DIAGNOSIS — F1020 Alcohol dependence, uncomplicated: Secondary | ICD-10-CM | POA: Insufficient documentation

## 2012-07-17 DIAGNOSIS — J45909 Unspecified asthma, uncomplicated: Secondary | ICD-10-CM | POA: Insufficient documentation

## 2012-07-17 DIAGNOSIS — F1120 Opioid dependence, uncomplicated: Secondary | ICD-10-CM | POA: Insufficient documentation

## 2012-07-17 DIAGNOSIS — F1910 Other psychoactive substance abuse, uncomplicated: Secondary | ICD-10-CM

## 2012-07-17 DIAGNOSIS — F17200 Nicotine dependence, unspecified, uncomplicated: Secondary | ICD-10-CM | POA: Insufficient documentation

## 2012-07-17 HISTORY — DX: Other psychoactive substance abuse, uncomplicated: F19.10

## 2012-07-17 HISTORY — DX: Other psychoactive substance use, unspecified, uncomplicated: F19.90

## 2012-07-17 HISTORY — DX: Unspecified asthma, uncomplicated: J45.909

## 2012-07-17 LAB — CBC WITH DIFFERENTIAL/PLATELET
Eosinophils Relative: 4 % (ref 0–5)
HCT: 46.1 % (ref 39.0–52.0)
Hemoglobin: 15.9 g/dL (ref 13.0–17.0)
Lymphocytes Relative: 38 % (ref 12–46)
Lymphs Abs: 2.6 10*3/uL (ref 0.7–4.0)
MCV: 87.8 fL (ref 78.0–100.0)
Monocytes Relative: 8 % (ref 3–12)
Platelets: 279 10*3/uL (ref 150–400)
RBC: 5.25 MIL/uL (ref 4.22–5.81)
WBC: 6.8 10*3/uL (ref 4.0–10.5)

## 2012-07-17 LAB — RAPID URINE DRUG SCREEN, HOSP PERFORMED
Amphetamines: NOT DETECTED
Benzodiazepines: NOT DETECTED
Opiates: NOT DETECTED
Tetrahydrocannabinol: NOT DETECTED

## 2012-07-17 LAB — COMPREHENSIVE METABOLIC PANEL
ALT: 18 U/L (ref 0–53)
AST: 19 U/L (ref 0–37)
Alkaline Phosphatase: 65 U/L (ref 39–117)
CO2: 26 mEq/L (ref 19–32)
GFR calc Af Amer: >90 mL/min (ref >90)
Glucose, Bld: 95 mg/dL (ref 70–99)
Potassium: 4 mEq/L (ref 3.5–5.1)
Sodium: 141 mEq/L (ref 135–145)
Total Protein: 7.5 g/dL (ref 6.0–8.3)

## 2012-07-17 MED ORDER — VITAMIN B-1 100 MG PO TABS
100.0000 mg | ORAL_TABLET | Freq: Every day | ORAL | Status: DC
  Administered 2012-07-18: 100 mg via ORAL
  Filled 2012-07-17: qty 1

## 2012-07-17 MED ORDER — ACETAMINOPHEN 325 MG PO TABS
650.0000 mg | ORAL_TABLET | ORAL | Status: DC | PRN
  Administered 2012-07-17: 650 mg via ORAL
  Filled 2012-07-17: qty 1

## 2012-07-17 MED ORDER — LORAZEPAM 1 MG PO TABS
1.0000 mg | ORAL_TABLET | Freq: Three times a day (TID) | ORAL | Status: DC | PRN
  Administered 2012-07-18: 1 mg via ORAL
  Filled 2012-07-17: qty 1

## 2012-07-17 MED ORDER — FOLIC ACID 1 MG PO TABS
1.0000 mg | ORAL_TABLET | Freq: Every day | ORAL | Status: DC
  Administered 2012-07-18: 1 mg via ORAL
  Filled 2012-07-17: qty 1

## 2012-07-17 MED ORDER — IBUPROFEN 600 MG PO TABS
600.0000 mg | ORAL_TABLET | Freq: Three times a day (TID) | ORAL | Status: DC | PRN
  Administered 2012-07-17 – 2012-07-18 (×2): 600 mg via ORAL
  Filled 2012-07-17 (×2): qty 1

## 2012-07-17 MED ORDER — THIAMINE HCL 100 MG/ML IJ SOLN: 100.0000 mg | Freq: Every day | INTRAMUSCULAR | Status: DC

## 2012-07-17 MED ORDER — ONDANSETRON HCL 4 MG PO TABS
4.0000 mg | ORAL_TABLET | Freq: Three times a day (TID) | ORAL | Status: DC | PRN
  Filled 2012-07-17: qty 1

## 2012-07-17 MED ORDER — NICOTINE 21 MG/24HR TD PT24
21.0000 mg | MEDICATED_PATCH | Freq: Every day | TRANSDERMAL | Status: DC
  Administered 2012-07-17 – 2012-07-18 (×2): 21 mg via TRANSDERMAL
  Filled 2012-07-17 (×2): qty 1

## 2012-07-17 MED ORDER — LORAZEPAM 1 MG PO TABS: 0.0000 mg | ORAL_TABLET | Freq: Two times a day (BID) | ORAL | Status: DC

## 2012-07-17 MED ORDER — LORAZEPAM 1 MG PO TABS
0.0000 mg | ORAL_TABLET | Freq: Four times a day (QID) | ORAL | Status: DC
  Administered 2012-07-17 – 2012-07-18 (×3): 1 mg via ORAL
  Filled 2012-07-17: qty 1

## 2012-07-17 MED ORDER — ONDANSETRON HCL 4 MG PO TABS
4.0000 mg | ORAL_TABLET | Freq: Four times a day (QID) | ORAL | Status: DC | PRN
  Administered 2012-07-18: 4 mg via ORAL

## 2012-07-17 MED ORDER — LORAZEPAM 1 MG PO TABS
1.0000 mg | ORAL_TABLET | Freq: Once | ORAL | Status: AC
  Administered 2012-07-17: 1 mg via ORAL

## 2012-07-17 MED ORDER — ADULT MULTIVITAMIN W/MINERALS CH
1.0000 | ORAL_TABLET | Freq: Every day | ORAL | Status: DC
  Administered 2012-07-18: 1 via ORAL
  Filled 2012-07-17: qty 1

## 2012-07-17 MED ORDER — LORAZEPAM 1 MG PO TABS
1.0000 mg | ORAL_TABLET | Freq: Four times a day (QID) | ORAL | Status: DC | PRN
  Filled 2012-07-17 (×3): qty 1

## 2012-07-17 MED ORDER — LORAZEPAM 2 MG/ML IJ SOLN: 1.0000 mg | Freq: Four times a day (QID) | INTRAMUSCULAR | Status: DC | PRN

## 2012-07-17 NOTE — ED Notes (Signed)
Pt brought in by parents for detox from heroin and ETOH, last used this afternoon around 1530.

## 2012-07-17 NOTE — ED Notes (Signed)
Pt. Has 1 belongings bag locked up in [place] [address]

## 2012-07-17 NOTE — ED Provider Notes (Signed)
History     CSN: [id number]  Arrival date & time 07/17/12  1850   First MD Initiated Contact with Patient 07/17/12 1945      Chief Complaint  Patient presents with  . Medical Clearance    (Consider location/radiation/quality/duration/timing/severity/associated sxs/prior treatment) HPI Comments: Devon [name] is a 26 y.o. Male who presents to ER with his father, requesting alcohol and heroin detox. States he was seen for similar complaint two weeks ago. Signed out himself AMA because "was not ready to quit." states now he is ready. Denies SI or HI. Last used and drank today. No other complaints. No medical problems.      Past Medical History  Diagnosis Date  . Asthma   . Poly-drug misuser     heroin, marj, benzo, etoh,     Past Surgical History  Procedure Date  . Orif clavicle fracture 2011    No family history on file.  History  Substance Use Topics  . Smoking status: Current Every Day Smoker -- 1.0 packs/day    Types: Cigarettes  . Smokeless tobacco: Never Used  . Alcohol Use: 75.6 oz/week    126 Cans of beer per week     scotch shots      Review of Systems  All other systems reviewed and are negative.    Allergies  Review of patient's allergies indicates no known allergies.  Home Medications   Current Outpatient Rx  Name Route Sig Dispense Refill  . IPRATROPIUM-ALBUTEROL 18-103 MCG/ACT IN AERO Inhalation Inhale 2 puffs into the lungs every 6 (six) hours as needed. breathing    . IBUPROFEN 200 MG PO TABS Oral Take 600 mg by mouth every 6 (six) hours as needed. headache      BP 143/69  Pulse 120  Temp 98 F (36.7 C) (Oral)  SpO2 95%  Physical Exam  Nursing note and vitals reviewed. Constitutional: He is oriented to person, place, and time. He appears well-developed and well-nourished. No distress.  HENT:  Head: Normocephalic and atraumatic.  Eyes: Conjunctivae normal are normal.       Pupils pin point  Neck: Neck supple.    Cardiovascular: Regular rhythm and normal heart sounds.        tachycardic  Pulmonary/Chest: Effort normal and breath sounds normal. No respiratory distress. He has no wheezes.  Abdominal: Soft. Bowel sounds are normal. He exhibits no distension. There is no tenderness. There is no rebound.  Neurological: He is alert and oriented to person, place, and time.       Poor coordination, appears intoxicated, unable to do finger to nose  Skin: Skin is warm and dry.  Psychiatric: He has a normal mood and affect. His behavior is normal. Thought content normal.    ED Course  Procedures (including critical care time)  Pt here for alcohol and heroin detox. Will get clearance labs.    11:06 PM Pt medically cleared. VS normal. He is calm cooperative. Labs normal. Alcohol 101, did not crossover. Spoke with [facility], will assess.   1. Polysubstance abuse       MDM          Lottie Mussel, PA 07/18/12 2100
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
## 2012-07-17 NOTE — ED Notes (Signed)
Pt transported to Psych ED. Talking with ACT.

## 2012-07-17 NOTE — ED Notes (Signed)
Pt has been wanded and scrubbed,  Denies SI or HI  Needs detox

## 2012-07-17 NOTE — ED Notes (Signed)
Patient is asleep, resting comfortably. Breathing WNL.    

## 2012-07-18 NOTE — ED Notes (Signed)
Patient is asleep, resting comfortably. Slide rail was raised to prevent pt falling off stretcher. Breathing WNL

## 2012-07-18 NOTE — Progress Notes (Signed)
[facility] needs:  Diabetic Seizure disorder Seizure withdrawal [facility] Must authorize service prior to [facility] reviewing for admission  Pt does not have the above.  Pt has no communicable diseases nor has he served in the [employer].  Pt has no known charges against him.  ACT will work on authorization process that is required up front but does not automatically qualify pt for treatment

## 2012-07-18 NOTE — ED Notes (Signed)
Pt's mom called in for an update. Told her that her son is here and the counselor and social worker work diligently to find placement for pt's but you never know how long it will take and that it's up to the pt to keep her updated on what's going on with his treatment. Mom asked to have pt sign a release of information for his parents. Told her he is currently sleeping but will ask him when he wakes up.

## 2012-07-18 NOTE — ED Notes (Signed)
Patient is awake, restless. Does not wish to have anything currently.

## 2012-07-18 NOTE — ED Notes (Addendum)
Patient is asleep, resting comfortably. Side rail was raised to prevent pt falling off stretcher. Breathing WNL

## 2012-07-18 NOTE — ED Provider Notes (Signed)
Pt requesting to leave  Toy Baker, MD 07/18/12 1334

## 2012-07-18 NOTE — Progress Notes (Signed)
Courtney at [employer] provided authorization for patient to be admitted to [facility].  Nicole at [facility] has accepted pt for treatment.  ACT went to inform pt and was informed he was discharged at his own request.  ACT contacted [facility] and [facility] and informed them and the auth given was rescinded.

## 2012-07-18 NOTE — ED Notes (Signed)
Patient is resting comfortably. Fidgeting with nose in sleep, Breathing is WNL.

## 2012-07-18 NOTE — ED Notes (Signed)
Pt asked if there was any word on a detox facility for him to go to yet. Informed him that the SW and CSW work on [employer] hasn't heard anything as of yet. Pt asked that they come and talk to him.

## 2012-07-18 NOTE — Progress Notes (Signed)
Pt left AMA but from pt perspective, he and his mother went to [facility] because they knew [facility] had a bed.  Pt believed the process at the ED was too slow.  However, because pt left AMA from [facility], [facility] would not accept pt to their service because pt could have reused heroin or alcohol after leaving ED.  Joni Reining at [facility] informed mother and pt to take pt to [facility] for a UDS.    ACT contacted Toni Amend at [facility] to ask her to put a note in the chart indicating that pt was now headed to [facility], but it was not in def[place] or hospital jumping but due to pt not understanding the system and how the process works.  ACT asked for [facility] to be flexible with said pt and authorize service when and if requested in the best interest of the pt since it appeared pt was trying to get the help he needs.  [facility] indicated they would if [place] called them. ACT called Joni Reining and informed her / [facility] of what [facility] is willing to do.

## 2012-07-19 ENCOUNTER — Emergency Department: Payer: Self-pay | Admitting: Emergency Medicine

## 2012-07-19 LAB — DRUG SCREEN, URINE
Amphetamines, Ur Screen: NEGATIVE (ref ?–1000)
Barbiturates, Ur Screen: NEGATIVE (ref ?–200)
Cocaine Metabolite,Ur ~~LOC~~: NEGATIVE (ref ?–300)
MDMA (Ecstasy)Ur Screen: NEGATIVE (ref ?–500)
Opiate, Ur Screen: POSITIVE (ref ?–300)
Phencyclidine (PCP) Ur S: NEGATIVE (ref ?–25)
Tricyclic, Ur Screen: NEGATIVE (ref ?–1000)

## 2012-07-19 LAB — COMPREHENSIVE METABOLIC PANEL
Albumin: 4.4 g/dL (ref 3.4–5.0)
BUN: 11 mg/dL (ref 7–18)
Bilirubin,Total: 0.3 mg/dL (ref 0.2–1.0)
Chloride: 107 mmol/L (ref 98–107)
Co2: 28 mmol/L (ref 21–32)
Creatinine: 0.95 mg/dL (ref 0.60–1.30)
EGFR (African American): >60
EGFR (Non-African Amer.): >60
Glucose: 91 mg/dL (ref 65–99)
Osmolality: 288 (ref 275–301)
Potassium: 4 mmol/L (ref 3.5–5.1)
SGPT (ALT): 27 U/L (ref 12–78)
Sodium: 145 mmol/L (ref 136–145)
Total Protein: 7.7 g/dL (ref 6.4–8.2)

## 2012-07-19 LAB — URINALYSIS, COMPLETE
Bilirubin,UR: NEGATIVE
Blood: NEGATIVE
Hyaline Cast: 1
Ketone: NEGATIVE
Nitrite: NEGATIVE
Ph: 6 (ref 4.5–8.0)
Protein: NEGATIVE
RBC,UR: <1 /HPF (ref 0–5)
Squamous Epithelial: NONE SEEN

## 2012-07-19 LAB — CBC
HGB: 15.6 g/dL (ref 13.0–18.0)
MCH: 30.4 pg (ref 26.0–34.0)
MCHC: 34.3 g/dL (ref 32.0–36.0)
MCV: 89 fL (ref 80–100)
RDW: 13.9 % (ref 11.5–14.5)

## 2012-07-19 LAB — ETHANOL: Ethanol: 139 mg/dL

## 2012-07-19 NOTE — ED Provider Notes (Signed)
Medical screening examination/treatment/procedure(s) were performed by non-physician practitioner and as supervising physician I was immediately available for consultation/collaboration.  Jones Skene, M.D.     Jones Skene, MD 07/19/12 1610

## 2012-08-10 ENCOUNTER — Emergency Department (HOSPITAL_COMMUNITY)
Admission: EM | Admit: 2012-08-10 | Discharge: 2012-08-10 | Disposition: A | Discharge status: Home / Self Care | Payer: Self-pay | Attending: Emergency Medicine | Admitting: Emergency Medicine

## 2012-08-10 ENCOUNTER — Encounter (HOSPITAL_COMMUNITY): Payer: Self-pay | Admitting: Physical Medicine and Rehabilitation

## 2012-08-10 DIAGNOSIS — F1210 Cannabis abuse, uncomplicated: Secondary | ICD-10-CM | POA: Insufficient documentation

## 2012-08-10 DIAGNOSIS — J45909 Unspecified asthma, uncomplicated: Secondary | ICD-10-CM | POA: Insufficient documentation

## 2012-08-10 DIAGNOSIS — F17200 Nicotine dependence, unspecified, uncomplicated: Secondary | ICD-10-CM | POA: Insufficient documentation

## 2012-08-10 DIAGNOSIS — F1120 Opioid dependence, uncomplicated: Secondary | ICD-10-CM | POA: Insufficient documentation

## 2012-08-10 DIAGNOSIS — F1010 Alcohol abuse, uncomplicated: Secondary | ICD-10-CM | POA: Insufficient documentation

## 2012-08-10 DIAGNOSIS — Z8781 Personal history of (healed) traumatic fracture: Secondary | ICD-10-CM | POA: Insufficient documentation

## 2012-08-10 DIAGNOSIS — F1920 Other psychoactive substance dependence, uncomplicated: Secondary | ICD-10-CM

## 2012-08-10 LAB — COMPREHENSIVE METABOLIC PANEL
AST: 21 U/L (ref 0–37)
CO2: 23 mEq/L (ref 19–32)
Calcium: 9.3 mg/dL (ref 8.4–10.5)
Creatinine, Ser: 1.09 mg/dL (ref 0.50–1.35)
GFR calc Af Amer: >90 mL/min (ref >90)
GFR calc non Af Amer: >90 mL/min (ref >90)
Total Protein: 7.9 g/dL (ref 6.0–8.3)

## 2012-08-10 LAB — RAPID URINE DRUG SCREEN, HOSP PERFORMED
Amphetamines: NOT DETECTED
Barbiturates: NOT DETECTED
Opiates: POSITIVE — AB
Tetrahydrocannabinol: POSITIVE — AB

## 2012-08-10 LAB — ACETAMINOPHEN LEVEL: Acetaminophen (Tylenol), Serum: <15 ug/mL (ref 10–30)

## 2012-08-10 LAB — CBC WITH DIFFERENTIAL/PLATELET
Eosinophils Relative: 2 % (ref 0–5)
HCT: 44.9 % (ref 39.0–52.0)
Hemoglobin: 15.9 g/dL (ref 13.0–17.0)
Lymphocytes Relative: 32 % (ref 12–46)
Lymphs Abs: 2.4 10*3/uL (ref 0.7–4.0)
MCH: 30.7 pg (ref 26.0–34.0)
MCV: 86.7 fL (ref 78.0–100.0)
Monocytes Absolute: 0.8 10*3/uL (ref 0.1–1.0)
Monocytes Relative: 10 % (ref 3–12)
Platelets: 206 10*3/uL (ref 150–400)
RBC: 5.18 MIL/uL (ref 4.22–5.81)
WBC: 7.5 10*3/uL (ref 4.0–10.5)

## 2012-08-10 NOTE — ED Notes (Signed)
Called no answer

## 2012-08-10 NOTE — ED Notes (Signed)
ACT team at bedside to evaluate the patient at this time.

## 2012-08-10 NOTE — ED Provider Notes (Signed)
History     CSN: [id number]  Arrival date & time 08/10/12  1810   First MD Initiated Contact with Patient 08/10/12 2147      Chief Complaint  Patient presents with  . Medical Clearance   HPI  History provided by the patient. Patient is a 26 year old male with history of polysubstance abuse who presents with requests for help with detox from heroin and alcohol. Patient admits to IV heroin use daily for the past several years. He also reports daily alcohol use in varying amounts. Patient last used heroin and alcohol earlier today. Patient denies having any SI or HI. He denies any other complaints.     Past Medical History  Diagnosis Date  . Asthma   . Poly-drug misuser     heroin, marj, benzo, etoh,     Past Surgical History  Procedure Date  . Orif clavicle fracture 2011    History reviewed. No pertinent family history.  History  Substance Use Topics  . Smoking status: Current Every Day Smoker -- 1.0 packs/day    Types: Cigarettes  . Smokeless tobacco: Never Used  . Alcohol Use: 0.0 oz/week     Comment: scotch shots      Review of Systems  Constitutional: Negative for fever and chills.  Respiratory: Negative for shortness of breath.   Cardiovascular: Negative for chest pain and palpitations.  Gastrointestinal: Positive for nausea. Negative for vomiting, abdominal pain, diarrhea and constipation.  Skin: Negative for rash.    Allergies  Review of patient's allergies indicates no known allergies.  Home Medications   Current Outpatient Rx  Name  Route  Sig  Dispense  Refill  . IPRATROPIUM-ALBUTEROL 18-103 MCG/ACT IN AERO   Inhalation   Inhale 2 puffs into the lungs every 6 (six) hours as needed. breathing         . IBUPROFEN 200 MG PO TABS   Oral   Take 600 mg by mouth every 6 (six) hours as needed. headache           BP 134/83  Pulse 91  Temp 97.1 F (36.2 C) (Oral)  Resp 20  SpO2 97%  Physical Exam  Nursing note and vitals  reviewed. Constitutional: He is oriented to person, place, and time. He appears well-developed and well-nourished. No distress.  HENT:  Head: Normocephalic.  Cardiovascular: Normal rate and regular rhythm.   Pulmonary/Chest: Effort normal and breath sounds normal.  Abdominal: Soft.  Neurological: He is alert and oriented to person, place, and time.  Skin: Skin is warm.    ED Course  Procedures   Results for orders placed during the hospital encounter of 08/10/12  CBC WITH DIFFERENTIAL      Component Value Range   WBC 7.5  4.0 - 10.5 K/uL   RBC 5.18  4.22 - 5.81 MIL/uL   Hemoglobin 15.9  13.0 - 17.0 g/dL   HCT 13.0  86.5 - 78.4 %   MCV 86.7  78.0 - 100.0 fL   MCH 30.7  26.0 - 34.0 pg   MCHC 35.4  30.0 - 36.0 g/dL   RDW 69.6  29.5 - 28.4 %   Platelets 206  150 - 400 K/uL   Neutrophils Relative 56  43 - 77 %   Neutro Abs 4.2  1.7 - 7.7 K/uL   Lymphocytes Relative 32  12 - 46 %   Lymphs Abs 2.4  0.7 - 4.0 K/uL   Monocytes Relative 10  3 - 12 %   Monocytes  Absolute 0.8  0.1 - 1.0 K/uL   Eosinophils Relative 2  0 - 5 %   Eosinophils Absolute 0.2  0.0 - 0.7 K/uL   Basophils Relative 0  0 - 1 %   Basophils Absolute 0.0  0.0 - 0.1 K/uL  URINE RAPID DRUG SCREEN (HOSP PERFORMED)      Component Value Range   Opiates POSITIVE (*) NONE DETECTED   Cocaine NONE DETECTED  NONE DETECTED   Benzodiazepines NONE DETECTED  NONE DETECTED   Amphetamines NONE DETECTED  NONE DETECTED   Tetrahydrocannabinol POSITIVE (*) NONE DETECTED   Barbiturates NONE DETECTED  NONE DETECTED  ETHANOL      Component Value Range   Alcohol, Ethyl (B) 138 (*) 0 - 11 mg/dL  ACETAMINOPHEN LEVEL      Component Value Range   Acetaminophen (Tylenol), Serum <15.0  10 - 30 ug/mL  COMPREHENSIVE METABOLIC PANEL      Component Value Range   Sodium 139  135 - 145 mEq/L   Potassium 4.2  3.5 - 5.1 mEq/L   Chloride 102  96 - 112 mEq/L   CO2 23  19 - 32 mEq/L   Glucose, Bld 107 (*) 70 - 99 mg/dL   BUN 14  6 - 23 mg/dL    Creatinine, Ser 4.09  0.50 - 1.35 mg/dL   Calcium 9.3  8.4 - 81.1 mg/dL   Total Protein 7.9  6.0 - 8.3 g/dL   Albumin 4.5  3.5 - 5.2 g/dL   AST 21  0 - 37 U/L   ALT 19  0 - 53 U/L   Alkaline Phosphatase 76  39 - 117 U/L   Total Bilirubin 0.2 (*) 0.3 - 1.2 mg/dL   GFR calc non Af Amer >90  >90 mL/min   GFR calc Af Amer >90  >90 mL/min       1. Drug abuse and dependence       MDM   patient seen and evaluated. Patient no acute distress. Patient denies any SI or HI or hallucinations.  Spoke with Berna Spare with [facility] act team. They will see patient and evaluate.  Patient was seen and evaluated by act team. Currently there are no beds available at [facility]. Patient does not wish to stay for other placement. He wishes to be discharged at this time.       Angus Seller, [place] 08/11/12 [phone]

## 2012-08-10 NOTE — ED Notes (Signed)
Pt presents to department for evaluation of medical clearance. States he wants to get detox from heroin and ETOH. Last use this morning. Denies pain at the time. Pt is conscious alert and oriented x4. Denies suicidal/homicial ideation. No signs of acute distress at the time.

## 2012-08-12 NOTE — ED Provider Notes (Signed)
Medical screening examination/treatment/procedure(s) were performed by non-physician practitioner and as supervising physician I was immediately available for consultation/collaboration.  Jones Skene, M.D.     Jones Skene, MD 08/12/12 1735

## 2012-09-11 ENCOUNTER — Encounter (HOSPITAL_COMMUNITY): Payer: Self-pay

## 2012-09-11 ENCOUNTER — Emergency Department (HOSPITAL_COMMUNITY)
Admission: EM | Admit: 2012-09-11 | Discharge: 2012-09-12 | Disposition: A | Discharge status: Rehabilitation Facility | Payer: Self-pay | Attending: *Deleted | Admitting: *Deleted

## 2012-09-11 DIAGNOSIS — F1910 Other psychoactive substance abuse, uncomplicated: Secondary | ICD-10-CM | POA: Insufficient documentation

## 2012-09-11 DIAGNOSIS — F1010 Alcohol abuse, uncomplicated: Secondary | ICD-10-CM | POA: Insufficient documentation

## 2012-09-11 DIAGNOSIS — J45909 Unspecified asthma, uncomplicated: Secondary | ICD-10-CM | POA: Insufficient documentation

## 2012-09-11 DIAGNOSIS — F17200 Nicotine dependence, unspecified, uncomplicated: Secondary | ICD-10-CM | POA: Insufficient documentation

## 2012-09-11 DIAGNOSIS — F1110 Opioid abuse, uncomplicated: Secondary | ICD-10-CM | POA: Insufficient documentation

## 2012-09-11 LAB — CBC WITH DIFFERENTIAL/PLATELET
HCT: 41 % (ref 39.0–52.0)
Hemoglobin: 14 g/dL (ref 13.0–17.0)
Lymphs Abs: 2 10*3/uL (ref 0.7–4.0)
MCH: 30.3 pg (ref 26.0–34.0)
Monocytes Relative: 8 % (ref 3–12)
Neutro Abs: 6.5 10*3/uL (ref 1.7–7.7)
Neutrophils Relative %: 70 % (ref 43–77)
RBC: 4.62 MIL/uL (ref 4.22–5.81)

## 2012-09-11 LAB — COMPREHENSIVE METABOLIC PANEL
AST: 17 U/L (ref 0–37)
Albumin: 3.9 g/dL (ref 3.5–5.2)
Calcium: 9.3 mg/dL (ref 8.4–10.5)
Creatinine, Ser: 1.01 mg/dL (ref 0.50–1.35)
GFR calc non Af Amer: >90 mL/min (ref >90)
Total Protein: 6.9 g/dL (ref 6.0–8.3)

## 2012-09-11 LAB — RAPID URINE DRUG SCREEN, HOSP PERFORMED
Amphetamines: NOT DETECTED
Barbiturates: NOT DETECTED
Benzodiazepines: POSITIVE — AB
Cocaine: NOT DETECTED
Tetrahydrocannabinol: POSITIVE — AB

## 2012-09-11 MED ORDER — ACETAMINOPHEN 325 MG PO TABS: 650.0000 mg | ORAL_TABLET | ORAL | Status: DC | PRN

## 2012-09-11 MED ORDER — FOLIC ACID 1 MG PO TABS
1.0000 mg | ORAL_TABLET | Freq: Every day | ORAL | Status: DC
  Administered 2012-09-11: 1 mg via ORAL
  Filled 2012-09-11: qty 1

## 2012-09-11 MED ORDER — ONDANSETRON 4 MG PO TBDP
8.0000 mg | ORAL_TABLET | Freq: Once | ORAL | Status: AC
  Administered 2012-09-11: 8 mg via ORAL
  Filled 2012-09-11: qty 2

## 2012-09-11 MED ORDER — LORAZEPAM 1 MG PO TABS
1.0000 mg | ORAL_TABLET | Freq: Once | ORAL | Status: AC
  Administered 2012-09-11: 1 mg via ORAL
  Filled 2012-09-11: qty 2

## 2012-09-11 MED ORDER — LORAZEPAM 1 MG PO TABS
0.0000 mg | ORAL_TABLET | Freq: Two times a day (BID) | ORAL | Status: DC
Start: 2012-09-13 — End: 2012-09-12

## 2012-09-11 MED ORDER — LORAZEPAM 1 MG PO TABS: 0.0000 mg | ORAL_TABLET | Freq: Four times a day (QID) | ORAL | Status: DC

## 2012-09-11 MED ORDER — VITAMIN B-1 100 MG PO TABS
100.0000 mg | ORAL_TABLET | Freq: Every day | ORAL | Status: DC
  Administered 2012-09-11: 100 mg via ORAL
  Filled 2012-09-11: qty 1

## 2012-09-11 MED ORDER — ONDANSETRON HCL 8 MG PO TABS: 4.0000 mg | ORAL_TABLET | Freq: Three times a day (TID) | ORAL | Status: DC | PRN

## 2012-09-11 MED ORDER — LORAZEPAM 1 MG PO TABS: 1.0000 mg | ORAL_TABLET | Freq: Four times a day (QID) | ORAL | Status: DC | PRN

## 2012-09-11 MED ORDER — ZOLPIDEM TARTRATE 5 MG PO TABS: 5.0000 mg | ORAL_TABLET | Freq: Every evening | ORAL | Status: DC | PRN

## 2012-09-11 MED ORDER — ALUM & MAG HYDROXIDE-SIMETH 200-200-20 MG/5ML PO SUSP: 30.0000 mL | ORAL | Status: DC | PRN

## 2012-09-11 MED ORDER — LORAZEPAM 2 MG/ML IJ SOLN: 1.0000 mg | Freq: Four times a day (QID) | INTRAMUSCULAR | Status: DC | PRN

## 2012-09-11 MED ORDER — THIAMINE HCL 100 MG/ML IJ SOLN: 100.0000 mg | Freq: Every day | INTRAMUSCULAR | Status: DC

## 2012-09-11 MED ORDER — ADULT MULTIVITAMIN W/MINERALS CH
1.0000 | ORAL_TABLET | Freq: Every day | ORAL | Status: DC
  Administered 2012-09-11: 1 via ORAL
  Filled 2012-09-11: qty 1

## 2012-09-11 MED ORDER — CLONIDINE HCL 0.1 MG PO TABS: 0.1000 mg | ORAL_TABLET | Freq: Once | ORAL | Status: DC

## 2012-09-11 MED ORDER — NICOTINE 21 MG/24HR TD PT24
21.0000 mg | MEDICATED_PATCH | Freq: Every day | TRANSDERMAL | Status: DC | PRN
  Administered 2012-09-11: 21 mg via TRANSDERMAL
  Filled 2012-09-11: qty 1

## 2012-09-11 MED ORDER — IBUPROFEN 200 MG PO TABS: 400.0000 mg | ORAL_TABLET | Freq: Three times a day (TID) | ORAL | Status: DC | PRN

## 2012-09-11 NOTE — ED Provider Notes (Signed)
History   This chart was scribed for Laray Anger, DO by Gerlean Ren, ED Scribe. This patient was seen in room TR05C/TR05C and the patient's care was started at 6:49 PM    CSN: [id number]  Arrival date & time 09/11/12  1634   First MD Initiated Contact with Patient 09/11/12 1847      Chief Complaint  Patient presents with  . detox from heroine      The history is provided by the patient. No language interpreter was used.   Devon [name] is a 26 y.o. male who presents to the Emergency Department  Per pt, c/o gradual onset and persistence of constant heroin and etoh abuse for the past 3 years.  Pt states he uses approx 0.25 to 0.5 grams of heroin per day for the past year. LD heroin this morning. Drinks approx one 6 to 12 pack of etoh per day. LD etoh last night. Pt requesting detox from both. States he last attended detox approx 8 months ago.  Denies SI, no HI.  Denies CP/SOB, no abd pain, no vomiting/diarrhea.    Past Medical History  Diagnosis Date  . Asthma   . Poly-drug misuser     heroin, marj, benzo, etoh,     Past Surgical History  Procedure Date  . Orif clavicle fracture 2011     History  Substance Use Topics  . Smoking status: Current Every Day Smoker -- 1.0 packs/day    Types: Cigarettes  . Smokeless tobacco: Never Used  . Alcohol Use: 0.0 oz/week     Comment: scotch shots      Review of Systems ROS: Statement: All systems negative except as marked or noted in the HPI; Constitutional: Negative for fever and chills. ; ; Eyes: Negative for eye pain, redness and discharge. ; ; ENMT: Negative for ear pain, hoarseness, nasal congestion, sinus pressure and sore throat. ; ; Cardiovascular: Negative for chest pain, palpitations, diaphoresis, dyspnea and peripheral edema. ; ; Respiratory: Negative for cough, wheezing and stridor. ; ; Gastrointestinal: Negative for nausea, vomiting, diarrhea, abdominal pain, blood in stool, hematemesis, jaundice and rectal  bleeding. . ; ; Genitourinary: Negative for dysuria, flank pain and hematuria. ; ; Musculoskeletal: Negative for back pain and neck pain. Negative for swelling and trauma.; ; Skin: Negative for pruritus, rash, abrasions, blisters, bruising and skin lesion.; ; Neuro: Negative for headache, lightheadedness and neck stiffness. Negative for weakness, altered level of consciousness , altered mental status, extremity weakness, paresthesias, involuntary movement, seizure and syncope.; Psych:  No SI, no SA, no HI, no hallucinations.     Allergies  Review of patient's allergies indicates no known allergies.  Home Medications   Current Outpatient Rx  Name  Route  Sig  Dispense  Refill  . IPRATROPIUM-ALBUTEROL 18-103 MCG/ACT IN AERO   Inhalation   Inhale 2 puffs into the lungs every 6 (six) hours as needed. breathing         . IBUPROFEN 200 MG PO TABS   Oral   Take 600 mg by mouth every 6 (six) hours as needed. headache           BP 123/70  Pulse 86  Temp 97.5 F (36.4 C) (Oral)  Resp 18  SpO2 96%  Physical Exam 1850: Physical examination:  Nursing notes reviewed; Vital signs and O2 SAT reviewed;  Constitutional: Well developed, Well nourished, Well hydrated, In no acute distress; Head:  Normocephalic, atraumatic; Eyes: EOMI, PERRL, No scleral icterus; ENMT: Mouth and pharynx  normal, Mucous membranes moist; Neck: Supple, Full range of motion, No lymphadenopathy; Cardiovascular: Regular rate and rhythm, No murmur, rub, or gallop; Respiratory: Breath sounds clear & equal bilaterally, No rales, rhonchi, wheezes.  Speaking full sentences with ease, Normal respiratory effort/excursion; Chest: Nontender, Movement normal; Abdomen: Soft, Nontender, Nondistended, Normal bowel sounds;; Extremities: Pulses normal, No tenderness, No edema, No calf edema or asymmetry.; Neuro: AA&Ox3, Major CN grossly intact.  Speech clear. Climbs on and off stretcher easily by himself. Gait steady. No gross focal motor or  sensory deficits in extremities.; Skin: Color normal, Warm, Dry.; Psych:  Anxious. Denies SI.    ED Course  Procedures    MDM  MDM Reviewed: nursing note and vitals Interpretation: labs   Results for orders placed during the hospital encounter of 09/11/12  CBC WITH DIFFERENTIAL      Component Value Range   WBC 9.3  4.0 - 10.5 K/uL   RBC 4.62  4.22 - 5.81 MIL/uL   Hemoglobin 14.0  13.0 - 17.0 g/dL   HCT 86.5  78.4 - 69.6 %   MCV 88.7  78.0 - 100.0 fL   MCH 30.3  26.0 - 34.0 pg   MCHC 34.1  30.0 - 36.0 g/dL   RDW 29.5  28.4 - 13.2 %   Platelets 229  150 - 400 K/uL   Neutrophils Relative 70  43 - 77 %   Neutro Abs 6.5  1.7 - 7.7 K/uL   Lymphocytes Relative 22  12 - 46 %   Lymphs Abs 2.0  0.7 - 4.0 K/uL   Monocytes Relative 8  3 - 12 %   Monocytes Absolute 0.7  0.1 - 1.0 K/uL   Eosinophils Relative 1  0 - 5 %   Eosinophils Absolute 0.1  0.0 - 0.7 K/uL   Basophils Relative 0  0 - 1 %   Basophils Absolute 0.0  0.0 - 0.1 K/uL  URINE RAPID DRUG SCREEN (HOSP PERFORMED)      Component Value Range   Opiates POSITIVE (*) NONE DETECTED   Cocaine NONE DETECTED  NONE DETECTED   Benzodiazepines POSITIVE (*) NONE DETECTED   Amphetamines NONE DETECTED  NONE DETECTED   Tetrahydrocannabinol POSITIVE (*) NONE DETECTED   Barbiturates NONE DETECTED  NONE DETECTED  COMPREHENSIVE METABOLIC PANEL      Component Value Range   Sodium 142  135 - 145 mEq/L   Potassium 4.0  3.5 - 5.1 mEq/L   Chloride 106  96 - 112 mEq/L   CO2 26  19 - 32 mEq/L   Glucose, Bld 127 (*) 70 - 99 mg/dL   BUN 10  6 - 23 mg/dL   Creatinine, Ser 4.40  0.50 - 1.35 mg/dL   Calcium 9.3  8.4 - 10.2 mg/dL   Total Protein 6.9  6.0 - 8.3 g/dL   Albumin 3.9  3.5 - 5.2 g/dL   AST 17  0 - 37 U/L   ALT 17  0 - 53 U/L   Alkaline Phosphatase 47  39 - 117 U/L   Total Bilirubin 0.2 (*) 0.3 - 1.2 mg/dL   GFR calc non Af Amer >90  >90 mL/min   GFR calc Af Amer >90  >90 mL/min  ETHANOL      Component Value Range   Alcohol,  Ethyl (B) <11  0 - 11 mg/dL     7253:  T/C to ACT:  will eval.  Pt requesting "something for the jitters" and "I'm really anxious."  Holding  orders written.         I personally performed the services described in this documentation, which was scribed in my presence. The recorded information has been reviewed and is accurate. Ilisha Blust Allison Quarry, DO 09/11/12 2009

## 2012-09-11 NOTE — ED Notes (Signed)
Pt states he wants to go through detox for heroine and ETOH use. Last use of heroine was this morning. Last time drinking was last night. Denies any SI or HI.

## 2012-09-11 NOTE — ED Notes (Addendum)
Patient says he has been doing heroin for two and a half years and he has consistently for a year.  He does heroin everyday.  He is ready for detox.  The last time he did heroin was 1000 this morning.  Patient is in paper scrubs but has not been "wanded"  yet.

## 2012-09-11 NOTE — ED Notes (Signed)
Patient is resting comfortably. 

## 2012-09-12 NOTE — ED Notes (Signed)
Pt's father here to pick him up.

## 2012-09-12 NOTE — ED Notes (Signed)
Pt sleeping, NAD, calm, no changes.

## 2012-09-12 NOTE — ED Notes (Signed)
Alert, NAD, calm, interactive, "feels rough", (denies: physical sx; denies: pain, nausea, sob, dizziness or other sx), reports anxiety 10/10. Father will be picking him up around 10:00 am.

## 2012-09-12 NOTE — ED Notes (Signed)
Pt's belongings in locker #5; belonging inventory sheet filled out and placed in notebook

## 2012-09-12 NOTE — ED Notes (Signed)
Up to b/r, steady gait, alert, NAD, calm.  

## 2012-09-12 NOTE — ED Notes (Signed)
Pt sleeping on R side, NAD, calm.

## 2012-09-12 NOTE — BH Assessment (Addendum)
Assessment Note   Devon [name] is an 26 y.o. male.  Patient had gone to [facility] on Wednesday (12/04) for a rehab bed.  He had mislead staff about the last time he had used heroin.  He had to leave the program because he started to get sick from not detoxing prior to being admitted.  Patient did not use for two days and used on Friday and today.  Today (12/07) around 10am he used .2 of a gram.  He usually uses a quarter to a half of a gram daily for the last 6 months.  Patient is also drinking between 6-pack and a 12-pack daily for the last 7 months.  Last use of ETOH was 12/06.  He is also smoking "a bowl pack" per day of marijuana.  Last use was 12/06.  Patient has tried benzodiazepines but said that his last use was a valium a week ago.  Patient denies regular use of benzos.  Pt denies any SI, HI or A/V hallucinations.  He also said that his father could probably provide transportation to a facility on 12/08 in the AM.  [facility]. [facility] by [facility].  She said that he can come after 08:00.  [facility] nurse should call [facility] to let them know when patient and father are leaving.  Let patient know that if it takes him more than an hour to get there then [facility] will not take him.  Sponsorship for detox was obtained through [employer].  Dr. Deretha [facility]) and nurse have been made aware of patient being accepted to [facility]. Axis I: 304.00 Opioid dependence, 303.90 ETOH dependence, 304.30 Cannabis dependence Axis II: Deferred Axis III:  Past Medical History  Diagnosis Date  . Asthma   . Poly-drug misuser     heroin, marj, benzo, etoh,    Axis IV: occupational problems and problems related to legal system/crime Axis V: 31-40 impairment in reality testing  Past Medical History:  Past Medical History  Diagnosis Date  . Asthma   . Poly-drug misuser     heroin, marj, benzo, etoh,     Past Surgical History  Procedure Date  . Orif clavicle fracture 2011    Family  History: No family history on file.  Social History:  reports that he has been smoking Cigarettes.  He has been smoking about 1 pack per day. He has never used smokeless tobacco. He reports that he drinks alcohol. He reports that he uses illicit drugs (Marijuana and Heroin) about 3 times per week.  Additional Social History:  Alcohol / Drug Use Pain Medications: Pt abusing heroin Prescriptions: No prescribed meds. Over the Counter: N/A History of alcohol / drug use?: Yes Withdrawal Symptoms: Cramps;Fever / Chills;Patient aware of relationship between substance abuse and physical/medical complications;Sweats;Irritability;Weakness Substance #1 Name of Substance 1: Heroin  1 - Age of First Use: 26 years of age 110 - Amount (size/oz): Uses 1/4 to 1/2 gram daily 1 - Frequency: Daily use 1 - Duration: Using at that rate for the last 6 months 1 - Last Use / Amount: 12/07 used .2 gram Substance #2 Name of Substance 2: ETOH 2 - Age of First Use: 26 years old 2 - Amount (size/oz): Between six and 12 beers daily 2 - Frequency: Daily use 2 - Duration: 7 months 2 - Last Use / Amount: 12/06 drank <6 pack Substance #3 Name of Substance 3: Marijuana 3 - Age of First Use: 26 years of age 110 -  Amount (size/oz): "bowl pack every day" 3 - Frequency: Daily use 3 - Duration: Over the last four years at that rate. 3 - Last Use / Amount: 12/06  Cannot recall amount  CIWA: CIWA-Ar BP: 105/66 mmHg Pulse Rate: 75  COWS: Clinical Opiate Withdrawal Scale (COWS) Resting Pulse Rate: Pulse Rate 80 or below Sweating: Subjective report of chills or flushing Restlessness: Able to sit still Pupil Size: Pupils pinned or normal size for room light Bone or Joint Aches: Not present Runny Nose or Tearing: Not present GI Upset: No GI symptoms Tremor: No tremor Yawning: No yawning Anxiety or Irritability: Patient reports increasing irritability or anxiousness Gooseflesh Skin: Skin is smooth COWS Total Score: 2    Allergies: No Known Allergies  Home Medications:  (Not in a hospital admission)  OB/GYN Status:  No LMP for male patient.  General Assessment Data Location of Assessment: [facility] ED ACT Assessment: Yes Living Arrangements: Parent Can pt return to current living arrangement?: Yes Admission Status: Voluntary Is patient capable of signing voluntary admission?: Yes Transfer from: Acute Hospital Referral Source: Self/Family/Friend     Risk to self Suicidal Ideation: No Suicidal Intent: No Is patient at risk for suicide?: No Suicidal Plan?: No Access to Means: No What has been your use of drugs/alcohol within the last 12 months?: Daily use of heroin, ETOH, THC Previous Attempts/Gestures: No How many times?: 0  Other Self Harm Risks: Pt denies Triggers for Past Attempts: None known Intentional Self Injurious Behavior: None Family Suicide History: No Recent stressful life event(s):  (On-going drug use) Persecutory voices/beliefs?: No Depression: Yes Depression Symptoms: Despondent;Loss of interest in usual pleasures;Feeling worthless/self pity Substance abuse history and/or treatment for substance abuse?: Yes Suicide prevention information given to non-admitted patients: Not applicable  Risk to Others Homicidal Ideation: No Thoughts of Harm to Others: No Current Homicidal Intent: No Current Homicidal Plan: No Access to Homicidal Means: No Identified Victim: No one History of harm to others?: No Assessment of Violence: None Noted Violent Behavior Description: Pt cooperative Does patient have access to weapons?: No Criminal Charges Pending?: Yes Describe Pending Criminal Charges: Possession of heroin Does patient have a court date: Yes Court Date:  ("Sometime in January")  Psychosis Hallucinations: None noted Delusions: None noted  Mental Status Report Appear/Hygiene: Disheveled Eye Contact: Good Motor Activity: Freedom of movement;Restlessness Speech:  Logical/coherent Level of Consciousness: Alert Mood: Depressed;Guilty Affect: Appropriate to circumstance Anxiety Level: Minimal Thought Processes: Coherent;Relevant Judgement: Unimpaired Orientation: Person;Place;Time;Situation Obsessive Compulsive Thoughts/Behaviors: None  Cognitive Functioning Concentration: Normal Memory: Recent Intact;Remote Intact IQ: Average Insight: Good Impulse Control: Poor Appetite: Good Weight Loss: 0  Weight Gain: 0  Sleep: No Change Total Hours of Sleep: 7  (Sleeps good when high, not so much when sober) Vegetative Symptoms: None  ADLScreening [facility] Assessment Services) Patient's cognitive ability adequate to safely complete daily activities?: Yes Patient able to express need for assistance with ADLs?: Yes Independently performs ADLs?: Yes (appropriate for developmental age)  Abuse/Neglect [facility]) Physical Abuse: Denies Verbal Abuse: Denies Sexual Abuse: Denies  Prior Inpatient Therapy Prior Inpatient Therapy: Yes Prior Therapy Dates: December 2013 Prior Therapy Facilty/Provider(s): [facility] Reason for Treatment: Rehab bed  Prior Outpatient Therapy Prior Outpatient Therapy: No Prior Therapy Dates: n/a Prior Therapy Facilty/Provider(s): N/A Reason for Treatment: N/A  ADL Screening (condition at time of admission) Patient's cognitive ability adequate to safely complete daily activities?: Yes Patient able to express need for assistance with ADLs?: Yes Independently performs ADLs?: Yes (appropriate for developmental age) Weakness of Legs:  None Weakness of Arms/Hands: None       Abuse/Neglect Assessment (Assessment to be complete while patient is alone) Physical Abuse: Denies Verbal Abuse: Denies Sexual Abuse: Denies Exploitation of patient/patient's resources: Denies Self-Neglect: Denies Values / Beliefs Cultural Requests During Hospitalization: None Spiritual Requests During Hospitalization: None   Advance Directives (For  Healthcare) Advance Directive: Patient does not have advance directive;Patient would not like information    Additional Information 1:1 In Past 12 Months?: No CIRT Risk: No Elopement Risk: No Does patient have medical clearance?: Yes     Disposition:  Disposition Disposition of Patient: Inpatient treatment program;Referred to Type of inpatient treatment program: Adult Patient referred to: [facility]  On Site Evaluation by:   Reviewed with Physician:     Beatriz Stallion Ray 09/12/2012 12:07 AM

## 2012-10-18 ENCOUNTER — Emergency Department (HOSPITAL_COMMUNITY): Admission: EM | Admit: 2012-10-18 | Discharge: 2012-10-18 | Discharge status: Against Medical Advice | Payer: Self-pay

## 2012-10-18 NOTE — ED Notes (Signed)
Pt leaving and stated he will return back in the morning. Nurse [place] aware

## 2012-10-23 ENCOUNTER — Encounter (HOSPITAL_COMMUNITY): Payer: Self-pay | Admitting: Emergency Medicine

## 2012-10-23 ENCOUNTER — Emergency Department (HOSPITAL_COMMUNITY)
Admission: EM | Admit: 2012-10-23 | Discharge: 2012-10-24 | Disposition: A | Discharge status: Home / Self Care | Payer: Self-pay | Attending: Emergency Medicine | Admitting: Emergency Medicine

## 2012-10-23 DIAGNOSIS — F411 Generalized anxiety disorder: Secondary | ICD-10-CM | POA: Insufficient documentation

## 2012-10-23 DIAGNOSIS — R52 Pain, unspecified: Secondary | ICD-10-CM | POA: Insufficient documentation

## 2012-10-23 DIAGNOSIS — K7010 Alcoholic hepatitis without ascites: Secondary | ICD-10-CM | POA: Insufficient documentation

## 2012-10-23 DIAGNOSIS — R109 Unspecified abdominal pain: Secondary | ICD-10-CM | POA: Insufficient documentation

## 2012-10-23 DIAGNOSIS — F17200 Nicotine dependence, unspecified, uncomplicated: Secondary | ICD-10-CM | POA: Insufficient documentation

## 2012-10-23 DIAGNOSIS — J45909 Unspecified asthma, uncomplicated: Secondary | ICD-10-CM | POA: Insufficient documentation

## 2012-10-23 DIAGNOSIS — Z79899 Other long term (current) drug therapy: Secondary | ICD-10-CM | POA: Insufficient documentation

## 2012-10-23 DIAGNOSIS — F1910 Other psychoactive substance abuse, uncomplicated: Secondary | ICD-10-CM | POA: Insufficient documentation

## 2012-10-23 LAB — CBC WITH DIFFERENTIAL/PLATELET
Basophils Absolute: 0 10*3/uL (ref 0.0–0.1)
Basophils Relative: 0 % (ref 0–1)
HCT: 44.6 % (ref 39.0–52.0)
Lymphocytes Relative: 23 % (ref 12–46)
Monocytes Absolute: 0.5 10*3/uL (ref 0.1–1.0)
Neutro Abs: 5.2 10*3/uL (ref 1.7–7.7)
Neutrophils Relative %: 68 % (ref 43–77)
RDW: 14 % (ref 11.5–15.5)
WBC: 7.7 10*3/uL (ref 4.0–10.5)

## 2012-10-23 LAB — COMPREHENSIVE METABOLIC PANEL
ALT: 236 U/L — ABNORMAL HIGH (ref 0–53)
AST: 114 U/L — ABNORMAL HIGH (ref 0–37)
Albumin: 3.9 g/dL (ref 3.5–5.2)
Alkaline Phosphatase: 77 U/L (ref 39–117)
CO2: 25 mEq/L (ref 19–32)
Chloride: 103 mEq/L (ref 96–112)
Creatinine, Ser: 0.93 mg/dL (ref 0.50–1.35)
GFR calc non Af Amer: >90 mL/min (ref >90)
Potassium: 4.1 mEq/L (ref 3.5–5.1)
Sodium: 140 mEq/L (ref 135–145)
Total Bilirubin: 0.4 mg/dL (ref 0.3–1.2)

## 2012-10-23 LAB — RAPID URINE DRUG SCREEN, HOSP PERFORMED
Amphetamines: POSITIVE — AB
Barbiturates: NOT DETECTED
Benzodiazepines: NOT DETECTED
Cocaine: NOT DETECTED
Tetrahydrocannabinol: POSITIVE — AB

## 2012-10-23 MED ORDER — FOLIC ACID 1 MG PO TABS
1.0000 mg | ORAL_TABLET | Freq: Every day | ORAL | Status: DC
  Administered 2012-10-23 – 2012-10-24 (×2): 1 mg via ORAL
  Filled 2012-10-23 (×2): qty 1

## 2012-10-23 MED ORDER — ONDANSETRON HCL 4 MG PO TABS: 4.0000 mg | ORAL_TABLET | Freq: Three times a day (TID) | ORAL | Status: DC | PRN

## 2012-10-23 MED ORDER — ZOLPIDEM TARTRATE 5 MG PO TABS: 5.0000 mg | ORAL_TABLET | Freq: Every evening | ORAL | Status: DC | PRN

## 2012-10-23 MED ORDER — THIAMINE HCL 100 MG/ML IJ SOLN: 100.0000 mg | Freq: Every day | INTRAMUSCULAR | Status: DC

## 2012-10-23 MED ORDER — VITAMIN B-1 100 MG PO TABS
100.0000 mg | ORAL_TABLET | Freq: Every day | ORAL | Status: DC
  Administered 2012-10-23 – 2012-10-24 (×2): 100 mg via ORAL
  Filled 2012-10-23 (×2): qty 1

## 2012-10-23 MED ORDER — ADULT MULTIVITAMIN W/MINERALS CH
1.0000 | ORAL_TABLET | Freq: Every day | ORAL | Status: DC
  Administered 2012-10-23 – 2012-10-24 (×2): 1 via ORAL
  Filled 2012-10-23 (×2): qty 1

## 2012-10-23 MED ORDER — ACETAMINOPHEN 325 MG PO TABS
650.0000 mg | ORAL_TABLET | ORAL | Status: DC | PRN
  Administered 2012-10-24: 650 mg via ORAL
  Filled 2012-10-23: qty 2

## 2012-10-23 MED ORDER — LORAZEPAM 1 MG PO TABS: 0.0000 mg | ORAL_TABLET | Freq: Two times a day (BID) | ORAL | Status: DC

## 2012-10-23 MED ORDER — LORAZEPAM 2 MG/ML IJ SOLN: 1.0000 mg | Freq: Four times a day (QID) | INTRAMUSCULAR | Status: DC | PRN

## 2012-10-23 MED ORDER — IBUPROFEN 600 MG PO TABS: 600.0000 mg | ORAL_TABLET | Freq: Three times a day (TID) | ORAL | Status: DC | PRN

## 2012-10-23 MED ORDER — ALBUTEROL SULFATE HFA 108 (90 BASE) MCG/ACT IN AERS: 2.0000 | INHALATION_SPRAY | Freq: Four times a day (QID) | RESPIRATORY_TRACT | Status: DC | PRN

## 2012-10-23 MED ORDER — LORAZEPAM 1 MG PO TABS
1.0000 mg | ORAL_TABLET | Freq: Four times a day (QID) | ORAL | Status: DC | PRN
  Administered 2012-10-24: 1 mg via ORAL
  Filled 2012-10-23 (×2): qty 1

## 2012-10-23 MED ORDER — ALUM & MAG HYDROXIDE-SIMETH 200-200-20 MG/5ML PO SUSP: 30.0000 mL | ORAL | Status: DC | PRN

## 2012-10-23 MED ORDER — NICOTINE 21 MG/24HR TD PT24
21.0000 mg | MEDICATED_PATCH | Freq: Every day | TRANSDERMAL | Status: DC
  Administered 2012-10-23 – 2012-10-24 (×2): 21 mg via TRANSDERMAL
  Filled 2012-10-23 (×2): qty 1

## 2012-10-23 MED ORDER — LORAZEPAM 1 MG PO TABS
0.0000 mg | ORAL_TABLET | Freq: Four times a day (QID) | ORAL | Status: DC
  Administered 2012-10-23 – 2012-10-24 (×2): 1 mg via ORAL
  Filled 2012-10-23: qty 1

## 2012-10-23 NOTE — ED Notes (Signed)
Pt and belongings wanded. Per security pt has home-made pipe in his pants pocket

## 2012-10-23 NOTE — ED Provider Notes (Signed)
History  Scribed for Marlon Pel, PA-C/ Juliet Rude. Rubin Payor, MD, the patient was seen in room [facility]. This chart was scribed by Candelaria Stagers. The patient's care started at 5:32 PM  CSN: [id number]  Arrival date & time 10/23/12  1612   First MD Initiated Contact with Patient 10/23/12 1716      Chief Complaint  Patient presents with  . Medical Clearance     The history is provided by the patient. No language interpreter was used.   Devon [name] is a 27 y.o. male who presents to the Emergency Department for detox from alcohol and heroin that he started using about two years ago.  Pt last used heroin today he also admits to alcohol use today.  He reports he also smokes marijauna.  He denies SI or HI.  He has no health problems.   Past Medical History  Diagnosis Date  . Asthma   . Poly-drug misuser     heroin, marj, benzo, etoh,     Past Surgical History  Procedure Date  . Orif clavicle fracture 2011    No family history on file.  History  Substance Use Topics  . Smoking status: Current Every Day Smoker -- 1.0 packs/day    Types: Cigarettes  . Smokeless tobacco: Never Used  . Alcohol Use: 0.0 oz/week     Comment: scotch shots, beer daily      Review of Systems  Constitutional: Negative for fever and chills.  Respiratory: Negative for shortness of breath.   Gastrointestinal: Negative for nausea and vomiting.  Neurological: Negative for weakness.  Psychiatric/Behavioral: Negative for suicidal ideas and self-injury.  All other systems reviewed and are negative.    Allergies  Review of patient's allergies indicates no known allergies.  Home Medications   Current Outpatient Rx  Name  Route  Sig  Dispense  Refill  . ALBUTEROL SULFATE HFA 108 (90 BASE) MCG/ACT IN AERS   Inhalation   Inhale 2 puffs into the lungs every 6 (six) hours as needed. For shortness of breath.           BP 116/63  Pulse 102  Temp 98.4 F (36.9 C) (Oral)  Resp 18  SpO2  93%  Physical Exam  Nursing note and vitals reviewed. Constitutional: He is oriented to person, place, and time. He appears well-developed and well-nourished. No distress.  HENT:  Head: Normocephalic and atraumatic.  Eyes: EOM are normal.  Neck: Neck supple. No tracheal deviation present.  Cardiovascular: Normal rate and regular rhythm.   Pulmonary/Chest: Effort normal. No respiratory distress. He has no wheezes. He has no rales.  Musculoskeletal: Normal range of motion.  Neurological: He is alert and oriented to person, place, and time.  Skin: Skin is warm and dry.  Psychiatric: He has a normal mood and affect. His behavior is normal. He expresses no homicidal and no suicidal ideation.    ED Course  Procedures   DIAGNOSTIC STUDIES: Oxygen Saturation is 93% on room air, normal by my interpretation.    COORDINATION OF CARE: 5:17 PM Ordered: CBC with Differential; Comprehensive metabolic panel; Ethanol; Salicylate level; Acetaminophen; Drug screen panel, emergency. 5:35 PM Will call ACT team.  Pt understands and agrees.   5:45 PM ETOH withdrawal protocol ordered Labs Reviewed  COMPREHENSIVE METABOLIC PANEL - Abnormal; Notable for the following:    Glucose, Bld 132 (*)     AST 114 (*)     ALT 236 (*)     All other components within normal  limits  ETHANOL - Abnormal; Notable for the following:    Alcohol, Ethyl (B) 66 (*)     All other components within normal limits  SALICYLATE LEVEL - Abnormal; Notable for the following:    Salicylate Lvl <2.0 (*)     All other components within normal limits  URINE RAPID DRUG SCREEN (HOSP PERFORMED) - Abnormal; Notable for the following:    Opiates POSITIVE (*)     Amphetamines POSITIVE (*)     Tetrahydrocannabinol POSITIVE (*)     All other components within normal limits  CBC WITH DIFFERENTIAL  ACETAMINOPHEN LEVEL  LIPASE, BLOOD   No results found.   1. Polysubstance abuse   2. Elevated liver enzymes       MDM  pts liver  enzymes are elevated. Lipase ordered. Most likely due to alcohol abuse and drug usage. Pt will need Hepatitis work-up as an outpatient as discussed with Dr. Rubin Payor.  Lipase is normal.  I personally performed the services described in this documentation, which was scribed in my presence. The recorded information has been reviewed and is accurate.      Dorthula Matas, PA 10/23/12 2156

## 2012-10-23 NOTE — ED Provider Notes (Signed)
Medical screening examination/treatment/procedure(s) were performed by non-physician practitioner and as supervising physician I was immediately available for consultation/collaboration.  Sofiah Lyne R. Lin Hackmann, MD 10/23/12 2328 

## 2012-10-23 NOTE — ED Notes (Signed)
Pt states he is here for detox, last used this morning, heroin at 0900, ~ $25 worth. Has been ETOH around 1100-1230, beer 5 - 12 oz beers. Smoked mj. Pt denies SI or HI, states is just here to get clean

## 2012-10-24 LAB — HEPATITIS PANEL, ACUTE
HCV Ab: REACTIVE — AB
Hep A IgM: NEGATIVE
Hep B C IgM: NEGATIVE
Hepatitis B Surface Ag: NEGATIVE

## 2012-10-24 MED ORDER — LORAZEPAM 1 MG PO TABS: 2.0000 mg | ORAL_TABLET | Freq: Four times a day (QID) | ORAL | Status: DC | PRN

## 2012-10-24 MED ORDER — LORAZEPAM 2 MG/ML IJ SOLN: 2.0000 mg | Freq: Four times a day (QID) | INTRAMUSCULAR | Status: DC | PRN

## 2012-10-24 MED ORDER — OLANZAPINE 10 MG IM SOLR: 5.0000 mg | Freq: Two times a day (BID) | INTRAMUSCULAR | Status: DC | PRN

## 2012-10-24 NOTE — ED Notes (Signed)
Security in to re-wand pt

## 2012-10-24 NOTE — ED Notes (Signed)
At the nurses station requesting to be discharged. Dr Effie Shy notified.

## 2012-10-24 NOTE — BH Assessment (Signed)
Assessment Note   Devon [name] is an 27 y.o. male. Pt presents with request for detox from alcohol, opiates and THC. Pt was at [facility] for 4 days in early Dec. and says he relapsed Dec. 11 or 12. Pt has no hx of seizures. He denies SI and HI. No delusions present and he denies [facility]. Pt cooperative. He describes mood as sad and affect is mood congruent.  He endorses sadness, worthlessness and loss of interest. Pt has court date 1/23 for "two heroin charges". Pt  shot up  gram heroin am of 10/23/12. He drank five 12 oz beers 10/23/12 and smoked one bowl on [facility] 10/23/12. Pt states he has been using heroin, alcohol and THC daily since Dec 11/12. Pt has been inpatient at [facility], [facility] and [facility].  Pt sts his withdrawal symptoms "haven't started yet".  UDS was also positive for amphetamines but he denies use.  Pt says he wants to detox today b/c "I'm sick of being dependent on drugs.  Axis I: Opioid Dependence, ETOH dependence, cannabis dependence Axis II: Deferred Axis III:  Past Medical History  Diagnosis Date  . Asthma   . Poly-drug misuser     heroin, marj, benzo, etoh,    Axis IV: economic problems, occupational problems, other psychosocial or environmental problems, problems related to legal system/crime, problems related to social environment and problems with primary support group Axis V: 41-50 serious symptoms  Past Medical History:  Past Medical History  Diagnosis Date  . Asthma   . Poly-drug misuser     heroin, marj, benzo, etoh,     Past Surgical History  Procedure Date  . Orif clavicle fracture 2011    Family History: No family history on file.  Social History:  reports that he has been smoking Cigarettes.  He has been smoking about 1 pack per day. He has never used smokeless tobacco. He reports that he drinks alcohol. He reports that he uses illicit drugs (Marijuana, Heroin, and IV) about 3 times per week.  Additional Social History:  Alcohol / Drug Use Pain Medications: pt  abusing heroin Prescriptions: see PTA meds list Over the Counter: n/a History of alcohol / drug use?: Yes Longest period of sobriety (when/how long): pt unsure of length of time Negative Consequences of Use: Financial;Legal;Personal relationships;Work / Mining engineer #1 Name of Substance 1: heroin - shooting up 1 - Age of First Use: 24 1 - Amount (size/oz): 1/4 gram daily 1 - Frequency: daily 1 - Duration: since Dec. 11, prior to early Dec was using daily for 6 mos 1 - Last Use / Amount: 10/23/12 - 1/4 gram Substance #2 Name of Substance 2: etoh 2 - Age of First Use: 15 2 - Amount (size/oz): 12 pack beer 2 - Frequency: daily 2 - Duration: since Dec. 11, prior to early Dec was using daily for 6 mos 2 - Last Use / Amount: 10/23/12 - Five 12 oz beers Substance #3 Name of Substance 3: THC 3 - Age of First Use: 13 3 - Amount (size/oz): one bowl 3 - Frequency: daily 3 - Duration: since Dec. 11, prior to early Dec was using daily for 6 mos 3 - Last Use / Amount: 10/23/12- one bowl  CIWA: CIWA-Ar BP: 130/77 mmHg Pulse Rate: 102  Nausea and Vomiting: no nausea and no vomiting Tactile Disturbances: none Tremor: two Auditory Disturbances: not present Paroxysmal Sweats: no sweat visible Visual Disturbances: very mild sensitivity Anxiety: two Headache, Fullness in Head: none present Agitation:  normal activity Orientation and Clouding of Sensorium: oriented and can do serial additions CIWA-Ar Total: 5  COWS:    Allergies: No Known Allergies  Home Medications:  (Not in a hospital admission)  OB/GYN Status:  No LMP for male patient.  General Assessment Data [facility] of [facility]: [facility] Living Arrangements: Non-relatives/Friends Can pt return to current living arrangement?: Yes Admission Status: Voluntary Is patient capable of signing voluntary admission?: Yes Transfer from: Acute Hospital Referral Source: Self/Family/Friend  Education Status Is patient currently in  school?: No Current Grade: na Highest grade of school patient has completed: 76 Name of school: grad [employer]  Risk to self Suicidal Ideation: No Suicidal Intent: No Is patient at risk for suicide?: No Suicidal Plan?: No Access to Means: No What has been your use of drugs/alcohol within the last 12 months?: daily heroin, alcohol  & thc use Previous Attempts/Gestures: No How many times?: 0  Other Self Harm Risks: none Triggers for Past Attempts:  (na) Intentional Self Injurious Behavior: None Family Suicide History: No Recent stressful life event(s): Other (Comment) (substance dependence) Persecutory voices/beliefs?: No Depression: Yes Depression Symptoms: Despondent;Loss of interest in usual pleasures;Feeling worthless/self pity Substance abuse history and/or treatment for substance abuse?: Yes Suicide prevention information given to non-admitted patients: Not applicable  Risk to Others Homicidal Ideation: No Thoughts of Harm to Others: No Current Homicidal Intent: No Current Homicidal Plan: No Access to Homicidal Means: No Identified Victim: none History of harm to others?: No Assessment of Violence: None Noted Violent Behavior Description: pt cooperative and denies hx of violence Does patient have access to weapons?: No Criminal Charges Pending?: Yes Describe Pending Criminal Charges: heroin charges Does patient have a court date: Yes Court Date: 10/28/12  Psychosis Hallucinations: None noted Delusions: None noted  Mental Status Report Appear/Hygiene: Disheveled Eye Contact: Fair Motor Activity: Freedom of movement Speech: Logical/coherent Level of Consciousness: Alert Mood: Depressed;Anhedonia Affect: Appropriate to circumstance Anxiety Level: None Thought Processes: Coherent;Relevant Judgement: Unimpaired Orientation: Person;Place;Time;Situation Obsessive Compulsive Thoughts/Behaviors: None  Cognitive Functioning Concentration: Normal Memory:  Recent Intact;Remote Intact IQ: Average Insight: Fair Impulse Control: Poor Appetite: Fair Weight Loss: 0  Weight Gain: 0  Sleep: No Change Total Hours of Sleep: 7  (if using he can sleep well) Vegetative Symptoms: None  ADLScreening [facility] Assessment Services) Patient's cognitive ability adequate to safely complete daily activities?: Yes Patient able to express need for assistance with ADLs?: Yes Independently performs ADLs?: Yes (appropriate for developmental age)  Abuse/Neglect [facility]) Physical Abuse: Denies Verbal Abuse: Denies Sexual Abuse: Denies  Prior Inpatient Therapy Prior Inpatient Therapy: Yes Prior Therapy Dates: Dec 2013 - [facility] & [facility] Prior Therapy Facilty/Provider(s): also inpt at [facility] Reason for Treatment: detox, treatment  Prior Outpatient Therapy Prior Outpatient Therapy: No Prior Therapy Dates: n/a Prior Therapy Facilty/Provider(s): N/A Reason for Treatment: N/A  ADL Screening (condition at time of admission) Patient's cognitive ability adequate to safely complete daily activities?: Yes Patient able to express need for assistance with ADLs?: Yes Independently performs ADLs?: Yes (appropriate for developmental age) Weakness of Legs: None Weakness of Arms/Hands: None  Home Assistive Devices/Equipment Home Assistive Devices/Equipment: None    Abuse/Neglect Assessment (Assessment to be complete while patient is alone) Physical Abuse: Denies Verbal Abuse: Denies Sexual Abuse: Denies Exploitation of patient/patient's resources: Denies Self-Neglect: Denies Values / Beliefs Cultural Requests During Hospitalization: None Spiritual Requests During Hospitalization: None   Advance Directives (For Healthcare) Advance Directive: Patient does not have advance directive;Patient would not like information  Additional Information 1:1 In Past 12 Months?: No CIRT Risk: No Elopement Risk: No Does patient have medical clearance?: Yes     Disposition:    Disposition Disposition of Patient: Inpatient treatment program;Referred to Type of inpatient treatment program: Adult Patient referred to: [facility]  On Site Evaluation by:   Reviewed with Physician:     Donnamarie Rossetti P 10/24/2012 12:35 AM

## 2012-10-24 NOTE — ED Notes (Signed)
Pt up to the desk and reports he wants to leave and does not want to stay for eval. Dr Effie Shy aware and will see.

## 2012-10-24 NOTE — ED Notes (Signed)
Pt up to the bathroom,when he left the bathroom, smoke odor noted.  When questioned pt had hidden a pack of cigarettes and lighter under  his bed.  Items removed and locked in locker #  38 charge nurse aware, pt re-wanded by security.

## 2012-10-24 NOTE — ED Notes (Signed)
Pt sitting in room, requesting something for anxiety, pt medicated

## 2012-10-24 NOTE — ED Notes (Signed)
Dr wentz in w/ pt 

## 2012-10-24 NOTE — ED Provider Notes (Addendum)
Devon [name] is a 27 y.o. male who is being evaluated and treated for withdrawal from alcohol and heroin. This morning. He, states that he is starting to have body aches, and abdominal cramps. He last used heroin about 24 hours ago.  Patient is showing early signs of withdrawal from opiates. He appears to need continued treatment for withdrawals prior to discharge.   ACT will assess the patient for placement, this morning.     Flint Melter, MD 10/24/12 [phone]   Reassessed:He declines further treatment. Resources offered. D/C Home    Flint Melter, MD 10/24/12 1142  Flint Melter, MD 10/24/12 1147

## 2012-10-24 NOTE — ED Notes (Signed)
Pt ambulatory to dc window w/o difficulty, belongings and cigarettes/lighter returned after leaving unit. Pt encouraged to follow up as instructed.

## 2012-10-24 NOTE — ED Notes (Signed)
Dr wentz into see 

## 2012-10-25 NOTE — ED Notes (Signed)
+   Hepatitis Patient was informed that he had Hepatitis when he was here.

## 2012-11-01 ENCOUNTER — Emergency Department (HOSPITAL_COMMUNITY)
Admission: EM | Admit: 2012-11-01 | Discharge: 2012-11-01 | Disposition: A | Discharge status: Home / Self Care | Payer: Self-pay | Attending: Emergency Medicine | Admitting: Emergency Medicine

## 2012-11-01 ENCOUNTER — Encounter (HOSPITAL_COMMUNITY): Payer: Self-pay

## 2012-11-01 DIAGNOSIS — F3289 Other specified depressive episodes: Secondary | ICD-10-CM | POA: Insufficient documentation

## 2012-11-01 DIAGNOSIS — Z0289 Encounter for other administrative examinations: Secondary | ICD-10-CM | POA: Insufficient documentation

## 2012-11-01 DIAGNOSIS — F1910 Other psychoactive substance abuse, uncomplicated: Secondary | ICD-10-CM

## 2012-11-01 DIAGNOSIS — R748 Abnormal levels of other serum enzymes: Secondary | ICD-10-CM | POA: Insufficient documentation

## 2012-11-01 DIAGNOSIS — F17200 Nicotine dependence, unspecified, uncomplicated: Secondary | ICD-10-CM | POA: Insufficient documentation

## 2012-11-01 DIAGNOSIS — F329 Major depressive disorder, single episode, unspecified: Secondary | ICD-10-CM | POA: Insufficient documentation

## 2012-11-01 DIAGNOSIS — J45909 Unspecified asthma, uncomplicated: Secondary | ICD-10-CM | POA: Insufficient documentation

## 2012-11-01 DIAGNOSIS — F411 Generalized anxiety disorder: Secondary | ICD-10-CM | POA: Insufficient documentation

## 2012-11-01 LAB — CBC WITH DIFFERENTIAL/PLATELET
Basophils Absolute: 0 10*3/uL (ref 0.0–0.1)
Eosinophils Relative: 4 % (ref 0–5)
Lymphocytes Relative: 29 % (ref 12–46)
MCV: 88.3 fL (ref 78.0–100.0)
Neutro Abs: 5.2 10*3/uL (ref 1.7–7.7)
Neutrophils Relative %: 58 % (ref 43–77)
Platelets: 292 10*3/uL (ref 150–400)
RBC: 5.04 MIL/uL (ref 4.22–5.81)
RDW: 13.8 % (ref 11.5–15.5)
WBC: 8.9 10*3/uL (ref 4.0–10.5)

## 2012-11-01 LAB — COMPREHENSIVE METABOLIC PANEL
ALT: 339 U/L — ABNORMAL HIGH (ref 0–53)
AST: 212 U/L — ABNORMAL HIGH (ref 0–37)
Alkaline Phosphatase: 78 U/L (ref 39–117)
CO2: 25 mEq/L (ref 19–32)
Calcium: 9.1 mg/dL (ref 8.4–10.5)
GFR calc non Af Amer: >90 mL/min (ref >90)
Potassium: 4.1 mEq/L (ref 3.5–5.1)
Sodium: 138 mEq/L (ref 135–145)

## 2012-11-01 LAB — RAPID URINE DRUG SCREEN, HOSP PERFORMED
Amphetamines: POSITIVE — AB
Barbiturates: NOT DETECTED
Benzodiazepines: NOT DETECTED

## 2012-11-01 LAB — ETHANOL: Alcohol, Ethyl (B): 78 mg/dL — ABNORMAL HIGH (ref 0–11)

## 2012-11-01 MED ORDER — LOPERAMIDE HCL 2 MG PO CAPS: 2.0000 mg | ORAL_CAPSULE | ORAL | Status: DC | PRN

## 2012-11-01 MED ORDER — ONDANSETRON HCL 4 MG PO TABS: 4.0000 mg | ORAL_TABLET | Freq: Three times a day (TID) | ORAL | Status: DC | PRN

## 2012-11-01 MED ORDER — LORAZEPAM 1 MG PO TABS
1.0000 mg | ORAL_TABLET | Freq: Once | ORAL | Status: AC
  Administered 2012-11-01: 1 mg via ORAL
  Filled 2012-11-01: qty 1

## 2012-11-01 MED ORDER — NICOTINE 21 MG/24HR TD PT24
21.0000 mg | MEDICATED_PATCH | Freq: Every day | TRANSDERMAL | Status: DC
  Administered 2012-11-01: 21 mg via TRANSDERMAL
  Filled 2012-11-01: qty 1

## 2012-11-01 MED ORDER — DICYCLOMINE HCL 20 MG PO TABS: 20.0000 mg | ORAL_TABLET | Freq: Four times a day (QID) | ORAL | Status: DC | PRN

## 2012-11-01 MED ORDER — IBUPROFEN 600 MG PO TABS: 600.0000 mg | ORAL_TABLET | Freq: Three times a day (TID) | ORAL | Status: DC | PRN

## 2012-11-01 MED ORDER — CLONIDINE HCL 0.1 MG PO TABS: 0.1000 mg | ORAL_TABLET | Freq: Every day | ORAL | Status: DC

## 2012-11-01 MED ORDER — NAPROXEN 500 MG PO TABS: 500.0000 mg | ORAL_TABLET | Freq: Two times a day (BID) | ORAL | Status: DC | PRN

## 2012-11-01 MED ORDER — ACETAMINOPHEN 325 MG PO TABS: 650.0000 mg | ORAL_TABLET | ORAL | Status: DC | PRN

## 2012-11-01 MED ORDER — LORAZEPAM 1 MG PO TABS
1.0000 mg | ORAL_TABLET | Freq: Three times a day (TID) | ORAL | Status: DC | PRN
  Administered 2012-11-01: 1 mg via ORAL
  Filled 2012-11-01: qty 1

## 2012-11-01 MED ORDER — METHOCARBAMOL 500 MG PO TABS: 500.0000 mg | ORAL_TABLET | Freq: Three times a day (TID) | ORAL | Status: DC | PRN

## 2012-11-01 MED ORDER — ONDANSETRON 4 MG PO TBDP: 4.0000 mg | ORAL_TABLET | Freq: Four times a day (QID) | ORAL | Status: DC | PRN

## 2012-11-01 MED ORDER — CLONIDINE HCL 0.1 MG PO TABS: 0.1000 mg | ORAL_TABLET | ORAL | Status: DC

## 2012-11-01 MED ORDER — HYDROXYZINE HCL 25 MG PO TABS
25.0000 mg | ORAL_TABLET | Freq: Four times a day (QID) | ORAL | Status: DC | PRN
  Administered 2012-11-01: 25 mg via ORAL
  Filled 2012-11-01: qty 1

## 2012-11-01 MED ORDER — CLONIDINE HCL 0.1 MG PO TABS
0.1000 mg | ORAL_TABLET | Freq: Four times a day (QID) | ORAL | Status: DC
  Administered 2012-11-01 (×2): 0.1 mg via ORAL
  Filled 2012-11-01 (×2): qty 1

## 2012-11-01 NOTE — ED Provider Notes (Signed)
History     CSN: [id number]  Arrival date & time 11/01/12  1156   First MD Initiated Contact with Patient 11/01/12 1247      Chief Complaint  Patient presents with  . Medical Clearance    (Consider location/radiation/quality/duration/timing/severity/associated sxs/prior treatment) HPI Devon [name] is a 27 y.o. male who presents to ED complaining of alcohol, heroin, polysubstance abuse. States has been going on for "years."  States most recently is using daily. States unable to get his life under control. States wanting detox. Was here a month ago, left without detox at that time. Denies SI or HI. Denies any medical problems. Unable to stop on his own.   Past Medical History  Diagnosis Date  . Asthma   . Poly-drug misuser     heroin, marj, benzo, etoh,     Past Surgical History  Procedure Date  . Orif clavicle fracture 2011    History reviewed. No pertinent family history.  History  Substance Use Topics  . Smoking status: Current Every Day Smoker -- 1.0 packs/day    Types: Cigarettes  . Smokeless tobacco: Never Used  . Alcohol Use: 0.0 oz/week     Comment: daily use      Review of Systems  Constitutional: Negative for fever and chills.  HENT: Negative for neck pain and neck stiffness.   Respiratory: Negative.   Cardiovascular: Negative.   Gastrointestinal: Negative.   Musculoskeletal: Negative.   Skin: Negative.   Neurological: Negative for dizziness, seizures, weakness and headaches.  Psychiatric/Behavioral: Positive for agitation. Negative for suicidal ideas. The patient is nervous/anxious.        + for polysubstance abuse, depression    Allergies  Review of patient's allergies indicates no known allergies.  Home Medications   Current Outpatient Rx  Name  Route  Sig  Dispense  Refill  . ALBUTEROL SULFATE HFA 108 (90 BASE) MCG/ACT IN AERS   Inhalation   Inhale 2 puffs into the lungs every 6 (six) hours as needed. For shortness of breath.           BP 133/80  Pulse 106  Temp 98.5 F (36.9 C) (Oral)  Resp 18  SpO2 96%  Physical Exam  Nursing note and vitals reviewed. Constitutional: He is oriented to person, place, and time. He appears well-developed and well-nourished. No distress.  HENT:  Head: Normocephalic and atraumatic.  Eyes: Conjunctivae normal are normal.  Neck: Neck supple.  Cardiovascular: Normal rate, regular rhythm and normal heart sounds.   Pulmonary/Chest: Effort normal and breath sounds normal. No respiratory distress. He has no wheezes. He has no rales.  Abdominal: Soft. Bowel sounds are normal. He exhibits no distension. There is no tenderness. There is no rebound.  Musculoskeletal: He exhibits no edema.  Neurological: He is alert and oriented to person, place, and time.  Skin: Skin is warm and dry.  Psychiatric:       Pt appears anxious, tearful, unable to make eye contact    ED Course  Procedures (including critical care time)   Results for orders placed during the hospital encounter of 11/01/12  CBC WITH DIFFERENTIAL      Component Value Range   WBC 8.9  4.0 - 10.5 K/uL   RBC 5.04  4.22 - 5.81 MIL/uL   Hemoglobin 15.2  13.0 - 17.0 g/dL   HCT 40.9  81.1 - 91.4 %   MCV 88.3  78.0 - 100.0 fL   MCH 30.2  26.0 - 34.0 pg  MCHC 34.2  30.0 - 36.0 g/dL   RDW 16.1  09.6 - 04.5 %   Platelets 292  150 - 400 K/uL   Neutrophils Relative 58  43 - 77 %   Neutro Abs 5.2  1.7 - 7.7 K/uL   Lymphocytes Relative 29  12 - 46 %   Lymphs Abs 2.6  0.7 - 4.0 K/uL   Monocytes Relative 10  3 - 12 %   Monocytes Absolute 0.9  0.1 - 1.0 K/uL   Eosinophils Relative 4  0 - 5 %   Eosinophils Absolute 0.3  0.0 - 0.7 K/uL   Basophils Relative 0  0 - 1 %   Basophils Absolute 0.0  0.0 - 0.1 K/uL  COMPREHENSIVE METABOLIC PANEL      Component Value Range   Sodium 138  135 - 145 mEq/L   Potassium 4.1  3.5 - 5.1 mEq/L   Chloride 100  96 - 112 mEq/L   CO2 25  19 - 32 mEq/L   Glucose, Bld 88  70 - 99 mg/dL   BUN 13  6 -  23 mg/dL   Creatinine, Ser 4.09  0.50 - 1.35 mg/dL   Calcium 9.1  8.4 - 81.1 mg/dL   Total Protein 7.7  6.0 - 8.3 g/dL   Albumin 4.3  3.5 - 5.2 g/dL   AST 914 (*) 0 - 37 U/L   ALT 339 (*) 0 - 53 U/L   Alkaline Phosphatase 78  39 - 117 U/L   Total Bilirubin 0.5  0.3 - 1.2 mg/dL   GFR calc non Af Amer >90  >90 mL/min   GFR calc Af Amer >90  >90 mL/min  URINE RAPID DRUG SCREEN (HOSP PERFORMED)      Component Value Range   Opiates POSITIVE (*) NONE DETECTED   Cocaine NONE DETECTED  NONE DETECTED   Benzodiazepines NONE DETECTED  NONE DETECTED   Amphetamines POSITIVE (*) NONE DETECTED   Tetrahydrocannabinol POSITIVE (*) NONE DETECTED   Barbiturates NONE DETECTED  NONE DETECTED  ETHANOL      Component Value Range   Alcohol, Ethyl (B) 78 (*) 0 - 11 mg/dL   Pt medically cleared. Spoke with [facility], will assess.     No results found.   1. Polysubstance abuse       MDM          Lottie Mussel, PA 11/01/12 2127

## 2012-11-01 NOTE — ED Provider Notes (Signed)
Patient was waiting placement for alcohol and opioid detox. Patient was denied admission because of elevated AST and ALT. I reviewed his records and he does have elevations in the 200 range. I did reexamine the patient. He is not expressing any abdominal pain, nausea or vomiting. Right upper quadrant is nontender, no Murphy's sign no mass. I discussed the findings with the patient and he understands that he will need to be followed up. Skeletal the elevation of his transaminases does not suggest hepatitis at this time, likely related to drug or alcohol abuse. He does not have family doctor, therefore will be given GI to follow up to rule out more acute liver illness.  Patient does not wish to wait any longer for further attempts for placement. He now wishes to followup as an outpatient. There is no homicidality or suicidality, patient is appropriate for discharge.  Gilda Crease, MD 11/01/12 2102

## 2012-11-01 NOTE — Progress Notes (Signed)
Pt referred to [facility] pending review.   Catha Gosselin, LCSWA  [phone] .11/01/2012 19:17pm

## 2012-11-01 NOTE — ED Notes (Addendum)
Patient is asking for detox from heroin, alcohol, and benzos. Patient states the last time he used Heroin ws 0200 today, lst drank alcohol at 0900. Patient had 4 beers, Patient states he lst had 1.5 caps of Klonopins yesterday. Patient denies SI/HI.
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
## 2012-11-01 NOTE — BH Assessment (Addendum)
Assessment Note   Devon [name] is an 27 y.o. male who presents to the ED requesting detox from alcohol, heroine, and benzodiazepines. CSW met with pt at bedside to complete assessment. Pt denies SI/HI/AH/VH. Pt shares that he drinks 8-12 beers per day, uses 1/4 a gram of heroine per day, and uses benzodiazepines 3-4x per week with amount varying. Pt denies any other substance abuse. Pt reports he has been depressed regarding his addiction to the above drugs. Pt reports symptoms of depression of increased irritability. Pt denies any past suicidal or homicidal ideation.    Pt supports include pt mother, father, and older sister. Pt states that pt family will be able to provide transportation of patient to [facility]. Pt states that he has already been in contact with [facility]. CSW spoke with [facility] who agreed that patient can be referred to [facility] once medically cleared. [facility] is already aware of patient upcoming court date.   Axis I: Polysubstance abuse Axis II: Deferred Axis III:  Past Medical History  Diagnosis Date  . Asthma   . Poly-drug misuser     heroin, marj, benzo, etoh,    Axis IV: economic problems, housing problems, other psychosocial or environmental problems, problems related to legal system/crime and problems related to social environment Axis V: 41-50 serious symptoms  Past Medical History:  Past Medical History  Diagnosis Date  . Asthma   . Poly-drug misuser     heroin, marj, benzo, etoh,     Past Surgical History  Procedure Date  . Orif clavicle fracture 2011    Family History: History reviewed. No pertinent family history.  Social History:  reports that he has been smoking Cigarettes.  He has been smoking about 1 pack per day. He has never used smokeless tobacco. He reports that he drinks alcohol. He reports that he uses illicit drugs (Marijuana, Heroin, and IV) about 3 times per week.  Additional Social History:  Alcohol / Drug Use History of alcohol / drug use?:  Yes Substance #1 Name of Substance 1: Alcohol 1 - Age of First Use: 16 1 - Amount (size/oz): 8-12 beers 1 - Frequency: daily  1 - Duration: 2 years 1 - Last Use / Amount: this morning, 4 beers Substance #2 Name of Substance 2: Heroine  2 - Age of First Use: 23  2 - Amount (size/oz): 1/4 gram  2 - Frequency: daily  2 - Duration: 2 years 2 - Last Use / Amount: early this am, 2 am 1/4 gram  Substance #3 Name of Substance 3: Benzodiazepines, xanax, klonopin, valium  3 - Age of First Use: 19 3 - Amount (size/oz): 2-3 pils  3 - Frequency: 2 to 3 x per week  3 - Duration: 1.5 years 3 - Last Use / Amount: yesterday 2 klonopin 1 mg  CIWA: CIWA-Ar BP: 128/83 mmHg Pulse Rate: 110  Nausea and Vomiting: no nausea and no vomiting Tremor: no tremor Auditory Disturbances: not present Paroxysmal Sweats: barely perceptible sweating, palms moist Visual Disturbances: not present Anxiety: mildly anxious Headache, Fullness in Head: none present Agitation: somewhat more than normal activity Orientation and Clouding of Sensorium: oriented and can do serial additions COWS: Clinical Opiate Withdrawal Scale (COWS) Resting Pulse Rate: Pulse Rate 101-120 Sweating: No report of chills or flushing Restlessness: Frequent shifting or extraneous movements of legs/arms Pupil Size: Pupils pinned or normal size for room light Bone or Joint Aches: Not present Runny Nose or Tearing: Not present GI Upset: No GI symptoms Tremor: Tremor  can be felt, but not observed Anxiety or Irritability: Patient obviously irritable/anxious Gooseflesh Skin: Skin is smooth  Allergies: No Known Allergies  Home Medications:  (Not in a hospital admission)  OB/GYN Status:  No LMP for male patient.  General Assessment Data Location of Assessment: [facility] ED Living Arrangements: Non-relatives/Friends Admission Status: Voluntary Is patient capable of signing voluntary admission?: Yes Transfer from: Home Referral Source:  Self/Family/Friend  Education Status Is patient currently in school?: No Current Grade: na Highest grade of school patient has completed: 12  Risk to self Suicidal Ideation: No Suicidal Intent: No Is patient at risk for suicide?: No Suicidal Plan?: No Access to Means: No What has been your use of drugs/alcohol within the last 12 months?: daily use heroine, alcohol use  Previous Attempts/Gestures: No How many times?: 0  Other Self Harm Risks: none Triggers for Past Attempts: None known Intentional Self Injurious Behavior: None Family Suicide History: No Recent stressful life event(s): Other (Comment) (pt states stressful being dependent on drugs daily ) Persecutory voices/beliefs?: No Depression: Yes Depression Symptoms: Guilt;Feeling angry/irritable Substance abuse history and/or treatment for substance abuse?: Yes Suicide prevention information given to non-admitted patients: Not applicable  Risk to Others Homicidal Ideation: No Thoughts of Harm to Others: No Current Homicidal Intent: No Current Homicidal Plan: No Access to Homicidal Means: No Identified Victim: no one identified  History of harm to others?: No Assessment of Violence: None Noted Violent Behavior Description: none Does patient have access to weapons?: No Criminal Charges Pending?: Yes Describe Pending Criminal Charges: possession of heroine and heroine paraphenelia Does patient have a court date: Yes Court Date: 11/25/12  Psychosis Hallucinations: None noted Delusions: None noted  Mental Status Report Appear/Hygiene: Disheveled Eye Contact: Good Motor Activity: Restlessness Speech: Logical/coherent Level of Consciousness: Alert Mood: Anxious Affect: Appropriate to circumstance Anxiety Level: Minimal Thought Processes: Coherent;Relevant Judgement: Unimpaired Orientation: Person;Place;Time;Situation Obsessive Compulsive Thoughts/Behaviors: None  Cognitive Functioning Concentration:  Normal Memory: Recent Intact;Remote Intact IQ: Average Insight: Fair Impulse Control: Poor Appetite: Fair Weight Loss: 0  Weight Gain: 0  Sleep: No Change Total Hours of Sleep: 7  Vegetative Symptoms: None  ADLScreening [facility] Assessment Services) Patient's cognitive ability adequate to safely complete daily activities?: Yes Patient able to express need for assistance with ADLs?: Yes Independently performs ADLs?: Yes (appropriate for developmental age)  Abuse/Neglect [facility]) Physical Abuse: Denies Verbal Abuse: Denies Sexual Abuse: Denies  Prior Inpatient Therapy Prior Inpatient Therapy: Yes Prior Therapy Dates: december 2013  Prior Therapy Facilty/Provider(s): [facility]  Reason for Treatment: detox and rehab   Prior Outpatient Therapy Prior Outpatient Therapy: No Prior Therapy Dates: n/a  Prior Therapy Facilty/Provider(s): n/a Reason for Treatment: n/a  ADL Screening (condition at time of admission) Patient's cognitive ability adequate to safely complete daily activities?: Yes Patient able to express need for assistance with ADLs?: Yes Independently performs ADLs?: Yes (appropriate for developmental age)       Abuse/Neglect Assessment (Assessment to be complete while patient is alone) Physical Abuse: Denies Verbal Abuse: Denies Sexual Abuse: Denies Values / Beliefs Cultural Requests During Hospitalization: None Spiritual Requests During Hospitalization: None        Additional Information 1:1 In Past 12 Months?: No CIRT Risk: No Elopement Risk: No Does patient have medical clearance?: Yes     Disposition:  Disposition Disposition of Patient: Inpatient treatment program Type of inpatient treatment program: Adult Patient referred to: [facility]  On Site Evaluation by:   Reviewed with Physician:     Catha Gosselin A 11/01/2012 6:49  PM

## 2012-11-01 NOTE — Progress Notes (Addendum)
CSW met with pt at bedside as patient expressed that he wanted to go home within the next hour if pt did not have a disposition with in an hr. CSW explained to patient that patient had information under review at [facility]. Pt and csw discussed [facility]. Pt stated that he may interested in [facility]. CSW faxed referral to [facility] and pending review.   Catha Gosselin, LCSWA  [phone] .11/01/2012 8:22pm   [facility] denied patient due to high labs for AST and ALT. CSW will inform rn and edp.   Catha Gosselin, LCSWA  [phone] .11/01/2012 8:25pm

## 2012-11-02 NOTE — ED Provider Notes (Signed)
Medical screening examination/treatment/procedure(s) were performed by non-physician practitioner and as supervising physician I was immediately available for consultation/collaboration.  Marwan T Powers, MD 11/02/12 0739 

## 2013-01-10 ENCOUNTER — Encounter (HOSPITAL_COMMUNITY): Payer: Self-pay | Admitting: Emergency Medicine

## 2013-01-10 ENCOUNTER — Emergency Department (HOSPITAL_COMMUNITY)
Admission: EM | Admit: 2013-01-10 | Discharge: 2013-01-11 | Disposition: A | Discharge status: Rehabilitation Facility | Payer: Self-pay | Attending: Emergency Medicine | Admitting: Emergency Medicine

## 2013-01-10 DIAGNOSIS — F17200 Nicotine dependence, unspecified, uncomplicated: Secondary | ICD-10-CM | POA: Insufficient documentation

## 2013-01-10 DIAGNOSIS — J45909 Unspecified asthma, uncomplicated: Secondary | ICD-10-CM | POA: Insufficient documentation

## 2013-01-10 DIAGNOSIS — Z79899 Other long term (current) drug therapy: Secondary | ICD-10-CM | POA: Insufficient documentation

## 2013-01-10 DIAGNOSIS — F1910 Other psychoactive substance abuse, uncomplicated: Secondary | ICD-10-CM | POA: Insufficient documentation

## 2013-01-10 LAB — COMPREHENSIVE METABOLIC PANEL
BUN: 11 mg/dL (ref 6–23)
CO2: 25 mEq/L (ref 19–32)
Calcium: 9.5 mg/dL (ref 8.4–10.5)
GFR calc Af Amer: >90 mL/min (ref >90)
GFR calc non Af Amer: >90 mL/min (ref >90)
Glucose, Bld: 97 mg/dL (ref 70–99)
Total Protein: 7.6 g/dL (ref 6.0–8.3)

## 2013-01-10 LAB — CBC
HCT: 41.8 % (ref 39.0–52.0)
Hemoglobin: 14.8 g/dL (ref 13.0–17.0)
MCH: 30.5 pg (ref 26.0–34.0)
MCHC: 35.4 g/dL (ref 30.0–36.0)
MCV: 86 fL (ref 78.0–100.0)

## 2013-01-10 LAB — RAPID URINE DRUG SCREEN, HOSP PERFORMED
Barbiturates: NOT DETECTED
Cocaine: NOT DETECTED
Opiates: POSITIVE — AB

## 2013-01-10 LAB — ETHANOL: Alcohol, Ethyl (B): <11 mg/dL (ref 0–11)

## 2013-01-10 MED ORDER — LORAZEPAM 1 MG PO TABS
1.0000 mg | ORAL_TABLET | Freq: Three times a day (TID) | ORAL | Status: DC | PRN
  Administered 2013-01-10: 1 mg via ORAL
  Filled 2013-01-10 (×2): qty 1

## 2013-01-10 MED ORDER — ZOLPIDEM TARTRATE 5 MG PO TABS
5.0000 mg | ORAL_TABLET | Freq: Every evening | ORAL | Status: DC | PRN
  Administered 2013-01-11: 5 mg via ORAL
  Filled 2013-01-10: qty 1

## 2013-01-10 MED ORDER — ACETAMINOPHEN 325 MG PO TABS: 650.0000 mg | ORAL_TABLET | ORAL | Status: DC | PRN

## 2013-01-10 MED ORDER — NICOTINE 21 MG/24HR TD PT24
21.0000 mg | MEDICATED_PATCH | Freq: Every day | TRANSDERMAL | Status: DC
  Administered 2013-01-10: 21 mg via TRANSDERMAL
  Filled 2013-01-10: qty 1

## 2013-01-10 MED ORDER — ONDANSETRON HCL 8 MG PO TABS: 4.0000 mg | ORAL_TABLET | Freq: Three times a day (TID) | ORAL | Status: DC | PRN

## 2013-01-10 MED ORDER — IBUPROFEN 200 MG PO TABS
600.0000 mg | ORAL_TABLET | Freq: Three times a day (TID) | ORAL | Status: DC | PRN
  Administered 2013-01-11: 600 mg via ORAL
  Filled 2013-01-10: qty 1

## 2013-01-10 NOTE — ED Notes (Signed)
Pt having "the nods"-- states used IV- prior to admittance to the ED.

## 2013-01-10 NOTE — ED Notes (Signed)
Pt here requesting detox from ETOH and heroin; last drank last night and last heroin use today; pt sts drinks 12pk at least and uses 1/2 g heroin a day; pt last detoxed 2 months ago
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
## 2013-01-11 MED ORDER — LORAZEPAM 1 MG PO TABS: 1.0000 mg | ORAL_TABLET | Freq: Four times a day (QID) | ORAL | Status: DC | PRN

## 2013-01-11 MED ORDER — LORAZEPAM 2 MG/ML IJ SOLN: 1.0000 mg | Freq: Four times a day (QID) | INTRAMUSCULAR | Status: DC | PRN

## 2013-01-11 MED ORDER — VITAMIN B-1 100 MG PO TABS: 100.0000 mg | ORAL_TABLET | Freq: Every day | ORAL | Status: DC

## 2013-01-11 MED ORDER — LORAZEPAM 1 MG PO TABS: 0.0000 mg | ORAL_TABLET | Freq: Two times a day (BID) | ORAL | Status: DC

## 2013-01-11 MED ORDER — ADULT MULTIVITAMIN W/MINERALS CH: 1.0000 | ORAL_TABLET | Freq: Every day | ORAL | Status: DC

## 2013-01-11 MED ORDER — FOLIC ACID 1 MG PO TABS: 1.0000 mg | ORAL_TABLET | Freq: Every day | ORAL | Status: DC

## 2013-01-11 MED ORDER — LORAZEPAM 1 MG PO TABS
0.0000 mg | ORAL_TABLET | Freq: Four times a day (QID) | ORAL | Status: DC
  Administered 2013-01-11: 1 mg via ORAL

## 2013-01-11 MED ORDER — THIAMINE HCL 100 MG/ML IJ SOLN: 100.0000 mg | Freq: Every day | INTRAMUSCULAR | Status: DC

## 2013-01-11 NOTE — BH Assessment (Signed)
Assessment Note   Devon [name] is an 27 y.o. male.  Patient comes to [facility] tonight seeking detox from ETOH and heroin.  Patient reports drinking 12 pack per day of beer over the last 2 months.  Last drink was on 04/06 in evening.  Patient also uses half a gram (by injection) of heroin daily.  Patient's last use of that was 04/07 in AM.  Has been using heroin for last 2 years.  Patient also uses marijuana but only 1 time per week on average.  Patient has no HI, SI or A/V hallucinations.  He has been to [facility] back at the end of January.  He reports being sober for 2 months as his longest period of sobriety.  Patient reports that he is involved with drug court and that he has a date of 04/10 but that if he is in detox then his case gets continued.  Patient is seeking detox from ETOH and Heroin  Patient [facility] for [facility]. Axis I: 303.90 ETOH dependence; 304.00 Opioid dependence Axis II: Deferred Axis III:  Past Medical History  Diagnosis Date  . Asthma   . Poly-drug misuser     heroin, marj, benzo, etoh,    Axis IV: economic problems, occupational problems and problems related to legal system/crime Axis V: 31-40 impairment in reality testing  Past Medical History:  Past Medical History  Diagnosis Date  . Asthma   . Poly-drug misuser     heroin, marj, benzo, etoh,     Past Surgical History  Procedure Laterality Date  . Orif clavicle fracture  2011    Family History: History reviewed. No pertinent family history.  Social History:  reports that he has been smoking Cigarettes.  He has been smoking about 1.00 pack per day. He has never used smokeless tobacco. He reports that  drinks alcohol. He reports that he uses illicit drugs (Marijuana, Heroin, and IV) about 3 times per week.  Additional Social History:  Alcohol / Drug Use Pain Medications: None Prescriptions: N/A Over the Counter: N/A History of alcohol / drug use?: Yes Withdrawal Symptoms: Cramps;Fever /  Chills;Sweats;Tingling;Tremors;Weakness Substance #1 Name of Substance 1: ETOH, usually beer 1 - Age of First Use: 27 years old 1 - Amount (size/oz): 12 pack per day 1 - Frequency: Daily use 1 - Duration: Over the last 4 years 1 - Last Use / Amount: 04/06 Substance #2 Name of Substance 2: Heroin 2 - Age of First Use: 27 years of age 65 - Amount (size/oz): Half a gram per day 2 - Frequency: Daily use 2 - Duration: Last two years 2 - Last Use / Amount: 04/07  Substance #3 Name of Substance 3: Marijuana 3 - Age of First Use: 27 years of age 15 - Amount (size/oz): One joint per week 3 - Frequency: Once weekly 3 - Duration: on-going 3 - Last Use / Amount: 04/03  CIWA: CIWA-Ar BP: 110/75 mmHg Pulse Rate: 77 COWS: Clinical Opiate Withdrawal Scale (COWS) Resting Pulse Rate: Pulse Rate 80 or below Sweating: Subjective report of chills or flushing Restlessness: Able to sit still Pupil Size: Pupils pinned or normal size for room light Bone or Joint Aches: Mild diffuse discomfort Runny Nose or Tearing: Nasal stuffiness or unusually moist eyes GI Upset: No GI symptoms Tremor: Tremor can be felt, but not observed Yawning: Yawning once or twice during assessment Anxiety or Irritability: None Gooseflesh Skin: Skin is smooth COWS Total Score: 5  Allergies: No Known Allergies  Home Medications:  (Not in a hospital admission)  OB/GYN Status:  No LMP for male patient.  General Assessment Data Location of Assessment: [facility] ED Living Arrangements: Alone Can pt return to current living arrangement?: Yes Admission Status: Voluntary Is patient capable of signing voluntary admission?: Yes Transfer from: Acute Hospital Referral Source: Self/Family/Friend     Risk to self Suicidal Ideation: No Suicidal Intent: No Is patient at risk for suicide?: No Suicidal Plan?: No Access to Means: No What has been your use of drugs/alcohol within the last 12 months?: Daily use of heroin &  ETOH Previous Attempts/Gestures: No How many times?: 0 Other Self Harm Risks: SA issues Triggers for Past Attempts: None known Intentional Self Injurious Behavior: None Family Suicide History: No Recent stressful life event(s):  (Continuing addiction struggle) Persecutory voices/beliefs?: No Depression: Yes Depression Symptoms:  (Pt denies depressive symptoms) Substance abuse history and/or treatment for substance abuse?: Yes Suicide prevention information given to non-admitted patients: Not applicable  Risk to Others Homicidal Ideation: No Thoughts of Harm to Others: No Current Homicidal Intent: No Current Homicidal Plan: No Access to Homicidal Means: No Identified Victim: No one History of harm to others?: Yes Assessment of Violence: None Noted Violent Behavior Description: Pt calm & Cooperative Does patient have access to weapons?: No Criminal Charges Pending?: Yes (Possession of heroin.  ) Describe Pending Criminal Charges:  (Possession of heroin) Does patient have a court date: Yes Court Date:  (Drug court on 04/10)  Psychosis Hallucinations: None noted Delusions: None noted  Mental Status Report Appear/Hygiene: Disheveled Eye Contact: Fair Motor Activity: Hyperactivity Speech: Logical/coherent Level of Consciousness: Alert Mood: Anxious Affect: Anxious;Sad Anxiety Level: Moderate Thought Processes: Coherent;Relevant Judgement: Unimpaired Orientation: Person;Place;Situation Obsessive Compulsive Thoughts/Behaviors: None  Cognitive Functioning Concentration: Normal Memory: Recent Intact;Remote Intact IQ: Average Insight: Fair Impulse Control: Poor Appetite: Good Weight Loss: 0 Weight Gain: 0 Sleep: No Change Total Hours of Sleep: 10 Vegetative Symptoms: None  ADLScreening [facility] Assessment Services) Patient's cognitive ability adequate to safely complete daily activities?: Yes Patient able to express need for assistance with ADLs?: Yes Independently  performs ADLs?: Yes (appropriate for developmental age)  Abuse/Neglect [facility]) Physical Abuse: Denies Verbal Abuse: Denies Sexual Abuse: Denies  Prior Inpatient Therapy Prior Inpatient Therapy: Yes Prior Therapy Dates: Jan 2014; Nov 2013 Prior Therapy Facilty/Provider(s): [facility]; [facility] Reason for Treatment: Detox  Prior Outpatient Therapy Prior Outpatient Therapy: No Prior Therapy Dates: No Prior Therapy Facilty/Provider(s): N/A Reason for Treatment: N/A  ADL Screening (condition at time of admission) Patient's cognitive ability adequate to safely complete daily activities?: Yes Patient able to express need for assistance with ADLs?: Yes Independently performs ADLs?: Yes (appropriate for developmental age) Weakness of Legs: None Weakness of Arms/Hands: None  Home Assistive Devices/Equipment Home Assistive Devices/Equipment: None    Abuse/Neglect Assessment (Assessment to be complete while patient is alone) Physical Abuse: Denies Verbal Abuse: Denies Sexual Abuse: Denies Exploitation of patient/patient's resources: Denies Self-Neglect: Denies Values / Beliefs Cultural Requests During Hospitalization: None Spiritual Requests During Hospitalization: None   Advance Directives (For Healthcare) Advance Directive: Patient does not have advance directive;Patient would not like information    Additional Information 1:1 In Past 12 Months?: No CIRT Risk: No Elopement Risk: No Does patient have medical clearance?: Yes     Disposition:  Disposition Initial Assessment Completed for this Encounter: Yes Disposition of Patient: Inpatient treatment program;Referred to Type of inpatient treatment program: Adult Patient referred to: [facility]  On Site Evaluation by:   Reviewed with Physician:  Dr.Ghim  Beatriz Stallion Ray 01/11/2013 1:04 AM

## 2013-01-11 NOTE — ED Notes (Signed)
[facility] called

## 2013-01-11 NOTE — ED Notes (Signed)
Pt awakened to give  Medication.  cooperative

## 2013-01-11 NOTE — ED Notes (Signed)
Up to the br no complaints

## 2013-01-11 NOTE — ED Notes (Signed)
Pt states relapsed 2 weeks ago and is using .5 grams heroin daily . Last used yesterday morning. States he also would like detox from  Alcohol. States drinks at least a 12 pk  Of beer daily. Last alcohol Sunday night. He is calm and cooperative

## 2013-01-11 NOTE — BH Assessment (Signed)
[facility] Assessment Progress Note   Clinician received call back from [place] at [facility] saying patient had been accepted.  Clinician called [employer] and secured authorization for patient.  Patient informed that he had been accepted to [facility].  Parents will be picking him up between 08:00-08:30.  Patient informed that once he leaves, nursing staff have to call [facility] and let them know and he only has one hour to get there or he will be turned away.  Nurse staff can call [facility] at [phone].  Dr. Rulon Abide [facility]) and nurse Greta Doom informed of patient being accepted to [facility].

## 2013-01-12 NOTE — ED Provider Notes (Signed)
Medical screening examination/treatment/procedure(s) were performed by non-physician practitioner and as supervising physician I was immediately available for consultation/collaboration.  Glynn Octave, MD 01/12/13 1010

## 2013-06-20 ENCOUNTER — Emergency Department (HOSPITAL_COMMUNITY)
Admission: EM | Admit: 2013-06-20 | Discharge: 2013-06-21 | Disposition: A | Discharge status: Home / Self Care | Payer: PRIVATE HEALTH INSURANCE | Attending: Emergency Medicine | Admitting: Emergency Medicine

## 2013-06-20 ENCOUNTER — Encounter (HOSPITAL_COMMUNITY): Payer: Self-pay | Admitting: *Deleted

## 2013-06-20 DIAGNOSIS — F1910 Other psychoactive substance abuse, uncomplicated: Secondary | ICD-10-CM

## 2013-06-20 DIAGNOSIS — F1010 Alcohol abuse, uncomplicated: Secondary | ICD-10-CM | POA: Insufficient documentation

## 2013-06-20 DIAGNOSIS — F1110 Opioid abuse, uncomplicated: Secondary | ICD-10-CM | POA: Insufficient documentation

## 2013-06-20 DIAGNOSIS — F17200 Nicotine dependence, unspecified, uncomplicated: Secondary | ICD-10-CM | POA: Insufficient documentation

## 2013-06-20 DIAGNOSIS — J45909 Unspecified asthma, uncomplicated: Secondary | ICD-10-CM | POA: Insufficient documentation

## 2013-06-20 DIAGNOSIS — Z79899 Other long term (current) drug therapy: Secondary | ICD-10-CM | POA: Insufficient documentation

## 2013-06-20 LAB — COMPREHENSIVE METABOLIC PANEL
AST: 92 U/L — ABNORMAL HIGH (ref 0–37)
Albumin: 4.1 g/dL (ref 3.5–5.2)
BUN: 13 mg/dL (ref 6–23)
Calcium: 9.8 mg/dL (ref 8.4–10.5)
Chloride: 99 mEq/L (ref 96–112)
Creatinine, Ser: 0.9 mg/dL (ref 0.50–1.35)
Total Bilirubin: 0.3 mg/dL (ref 0.3–1.2)
Total Protein: 7.3 g/dL (ref 6.0–8.3)

## 2013-06-20 LAB — RAPID URINE DRUG SCREEN, HOSP PERFORMED
Benzodiazepines: NOT DETECTED
Opiates: POSITIVE — AB

## 2013-06-20 LAB — CBC
HCT: 41.9 % (ref 39.0–52.0)
MCH: 30.3 pg (ref 26.0–34.0)
MCHC: 34.4 g/dL (ref 30.0–36.0)
MCV: 88.2 fL (ref 78.0–100.0)
Platelets: 297 10*3/uL (ref 150–400)
RDW: 14.1 % (ref 11.5–15.5)

## 2013-06-20 LAB — SALICYLATE LEVEL: Salicylate Lvl: <2 mg/dL — ABNORMAL LOW (ref 2.8–20.0)

## 2013-06-20 LAB — ETHANOL: Alcohol, Ethyl (B): <11 mg/dL (ref 0–11)

## 2013-06-20 MED ORDER — METHOCARBAMOL 500 MG PO TABS
500.0000 mg | ORAL_TABLET | Freq: Three times a day (TID) | ORAL | Status: DC | PRN
  Administered 2013-06-20: 500 mg via ORAL
  Filled 2013-06-20: qty 1

## 2013-06-20 MED ORDER — ADULT MULTIVITAMIN W/MINERALS CH
1.0000 | ORAL_TABLET | Freq: Every day | ORAL | Status: DC
  Administered 2013-06-20: 1 via ORAL
  Filled 2013-06-20: qty 1

## 2013-06-20 MED ORDER — VITAMIN B-1 100 MG PO TABS
100.0000 mg | ORAL_TABLET | Freq: Every day | ORAL | Status: DC
  Administered 2013-06-20: 100 mg via ORAL
  Filled 2013-06-20: qty 1

## 2013-06-20 MED ORDER — LOPERAMIDE HCL 2 MG PO CAPS: 2.0000 mg | ORAL_CAPSULE | ORAL | Status: DC | PRN

## 2013-06-20 MED ORDER — HYDROXYZINE HCL 25 MG PO TABS: 25.0000 mg | ORAL_TABLET | Freq: Four times a day (QID) | ORAL | Status: DC | PRN

## 2013-06-20 MED ORDER — NAPROXEN 250 MG PO TABS: 500.0000 mg | ORAL_TABLET | Freq: Two times a day (BID) | ORAL | Status: DC | PRN

## 2013-06-20 MED ORDER — CLONIDINE HCL 0.1 MG PO TABS: 0.1000 mg | ORAL_TABLET | ORAL | Status: DC

## 2013-06-20 MED ORDER — LORAZEPAM 1 MG PO TABS: 0.0000 mg | ORAL_TABLET | Freq: Two times a day (BID) | ORAL | Status: DC

## 2013-06-20 MED ORDER — LORAZEPAM 1 MG PO TABS
0.0000 mg | ORAL_TABLET | Freq: Four times a day (QID) | ORAL | Status: DC
  Administered 2013-06-20 (×2): 1 mg via ORAL
  Filled 2013-06-20 (×2): qty 1

## 2013-06-20 MED ORDER — CLONIDINE HCL 0.1 MG PO TABS
0.1000 mg | ORAL_TABLET | Freq: Four times a day (QID) | ORAL | Status: DC
  Administered 2013-06-20 (×2): 0.1 mg via ORAL
  Filled 2013-06-20 (×2): qty 1

## 2013-06-20 MED ORDER — NICOTINE 21 MG/24HR TD PT24
21.0000 mg | MEDICATED_PATCH | Freq: Every day | TRANSDERMAL | Status: DC
  Administered 2013-06-20: 21 mg via TRANSDERMAL
  Filled 2013-06-20: qty 1

## 2013-06-20 MED ORDER — DICYCLOMINE HCL 20 MG PO TABS: 20.0000 mg | ORAL_TABLET | Freq: Four times a day (QID) | ORAL | Status: DC | PRN

## 2013-06-20 MED ORDER — CLONIDINE HCL 0.1 MG PO TABS: 0.1000 mg | ORAL_TABLET | Freq: Every day | ORAL | Status: DC

## 2013-06-20 MED ORDER — LORAZEPAM 2 MG/ML IJ SOLN: 1.0000 mg | Freq: Four times a day (QID) | INTRAMUSCULAR | Status: DC | PRN

## 2013-06-20 MED ORDER — LORAZEPAM 1 MG PO TABS: 1.0000 mg | ORAL_TABLET | Freq: Four times a day (QID) | ORAL | Status: DC | PRN

## 2013-06-20 MED ORDER — ONDANSETRON 4 MG PO TBDP: 4.0000 mg | ORAL_TABLET | Freq: Four times a day (QID) | ORAL | Status: DC | PRN

## 2013-06-20 MED ORDER — FOLIC ACID 1 MG PO TABS
1.0000 mg | ORAL_TABLET | Freq: Every day | ORAL | Status: DC
  Administered 2013-06-20: 1 mg via ORAL
  Filled 2013-06-20: qty 1

## 2013-06-20 MED ORDER — THIAMINE HCL 100 MG/ML IJ SOLN: 100.0000 mg | Freq: Every day | INTRAMUSCULAR | Status: DC

## 2013-06-20 NOTE — ED Notes (Signed)
SPOKE WITH LOU AT [facility]. SHE ADVISES SHOULD HAVE A [place] DETOX BED IN THE MORNING.

## 2013-06-20 NOTE — ED Notes (Signed)
PATIENT REPORTS USING 1/2 GRAM HEROIN IV FOR THE PAST 3 YEARS OR SO. STATES HE HAS HAD A FEW CLEAN DAYS HERE AND THERE. LAST TREATMENT WAS AROUND 4 YEAR AGO AND HE WENT TO [place]. STATES IT DIDN'T LAST. STATES HE WENT FOR HIS PARENTS. PATIENT ALSO STATES HE HAS BEEN DRINKING AROUND A 12 PACK OF BEER DAILY FOR THE PAST FEW YEARS. STATES THAT HIS LAST BEER WAS AROUND 11 PM LAST NIGHT. LAST USED A "COUPLE BAGS" OF HEROIN THIS MORNING AROUND 8 AM. 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 STATES HE WANTS TO DO INPATIENT. STATES HE IS TIRED OF USING. STATES COMING TO ED TODAY WAS HIS IDEA. WOULD LIKE TO GO TO [facility] IF POSSIBLE.

## 2013-06-20 NOTE — ED Notes (Signed)
Pt has arrived on pod c 

## 2013-06-20 NOTE — ED Notes (Signed)
[facility] CONSULT CALLED TO [name] 

## 2013-06-20 NOTE — ED Notes (Signed)
CALLED [facility]. [phone] AND THEY WILL CALL BACK ABOUT BED AVAILABILITY

## 2013-06-20 NOTE — ED Notes (Addendum)
Pt here to detox from heroin and etoh.  Last shot up heroin this morning and last drank etoh last night.  Normally drinks 1 12 pack beer per day and 1/2 g heroin per day.

## 2013-06-20 NOTE — ED Notes (Signed)
Pt resting comfortably. No signs or symptoms of withdrawal noted. Pt denies nausea, headache, blurred vision and states he "feels fine." No tremors or unsteady gait noted.

## 2013-06-20 NOTE — ED Provider Notes (Signed)
  CHIEF COMPLAINT: Detox from heroin and alcohol  HPI: Patient is a 27 y.o. male with a history of alcohol and heroin abuse. Patient reports that he uses a proximal to have progressive heroin per day and drinks a 12 pack of beer daily. He has been doing this for the past 2 years. He is here today requesting detox. He last used approximately 2 hours prior to arrival. Denies any fever, chest pain or shortness of breath, cough, vomiting or diarrhea. No other substance abuse. Denies any suicidal ideation or homicidal ideation. No hallucinations or delusions.  ROS: See HPI Constitutional: no fever  Eyes: no drainage  ENT: no runny nose   Cardiovascular:  no chest pain  Resp: no SOB  GI: no vomiting GU: no dysuria Integumentary: no rash  Allergy: no hives  Musculoskeletal: no leg swelling  Neurological: no slurred speech ROS otherwise negative  PAST MEDICAL HISTORY/PAST SURGICAL HISTORY:  Past Medical History  Diagnosis Date  . Asthma   . Poly-drug misuser     heroin, marj, benzo, etoh,     MEDICATIONS:  Prior to Admission medications   Medication Sig Start Date End Date Taking? Authorizing Provider  albuterol (PROVENTIL HFA;VENTOLIN HFA) 108 (90 BASE) MCG/ACT inhaler Inhale 2 puffs into the lungs every 6 (six) hours as needed for wheezing or shortness of breath.    Yes Historical Provider, MD    ALLERGIES:  No Known Allergies  SOCIAL HISTORY:  History  Substance Use Topics  . Smoking status: Current Every Day Smoker -- 1.00 packs/day    Types: Cigarettes  . Smokeless tobacco: Never Used  . Alcohol Use: 0.0 oz/week     Comment: daily use    FAMILY HISTORY: No family history on file.  EXAM: BP 140/63  Pulse 79  Temp(Src) 98 F (36.7 C) (Oral)  Resp 12  Ht 5\' 11"  (1.803 m)  Wt 180 lb (81.647 kg)  BMI 25.12 kg/m2  SpO2 100% CONSTITUTIONAL: Alert and oriented and responds appropriately to questions. Well-appearing; well-nourished, very pleasant HEAD:  Normocephalic EYES: Conjunctivae clear, PERRL ENT: normal nose; no rhinorrhea; moist mucous membranes; pharynx without lesions noted NECK: Supple, no meningismus, no LAD  CARD: RRR; S1 and S2 appreciated; no murmurs, no clicks, no rubs, no gallops RESP: Normal chest excursion without splinting or tachypnea; breath sounds clear and equal bilaterally; no wheezes, no rhonchi, no rales,  ABD/GI: Normal bowel sounds; non-distended; soft, non-tender, no rebound, no guarding BACK:  The back appears normal and is non-tender to palpation, there is no CVA tenderness EXT: Normal ROM in all joints; non-tender to palpation; no edema; normal capillary refill; no cyanosis    SKIN: Normal color for age and race; warm NEURO: Moves all extremities equally, no facial droop, no slurred speech PSYCH: The patient's mood and manner are appropriate. Grooming and personal hygiene are appropriate.  MEDICAL DECISION MAKING:  Patient here requesting alcohol and heroine detox. He is currently medically cleared. Discussed with [facility] who reports they have a bed available for detox the patient will go to in the morning. Patient currently has no safety concerns and is here voluntarily.      Layla Maw Ward, DO 06/20/13 2324

## 2013-06-21 NOTE — BH Assessment (Signed)
Assessment Note  Devon [name] is a 27 y.o. male who presents to [facility] for detox. Pt denies SI/HI/Psych.  Pt states that he's recently been to [facility] for treatment in 2014, but did not complete the program--"I wasn't ready and I left early".  Pt consumes a 12 pack of beer daily, last use was 06/20/13, pt drank a 12 pack.  Pt uses 1/2-1 gram of heroin, daily, last use was 06/20/13, pt used 1/8 of gram of heroin. Pt shoots in bilateral arms. Pt tells this writer-"my friends are dying and I'm gonna die", says he's ready to complete a SA program.  Pt c/o w/d sxs: sweats--denies any seizure/blackout activity.     Axis I: Alcohol Dependence; Opioid Dependence  Axis II: Deferred Axis III:  Past Medical History  Diagnosis Date  . Asthma   . Poly-drug misuser     heroin, marj, benzo, etoh,    Axis IV: other psychosocial or environmental problems, problems related to social environment and problems with primary support group Axis V: 51-60 moderate symptoms  Past Medical History:  Past Medical History  Diagnosis Date  . Asthma   . Poly-drug misuser     heroin, marj, benzo, etoh,     Past Surgical History  Procedure Laterality Date  . Orif clavicle fracture  2011    Family History: No family history on file.  Social History:  reports that he has been smoking Cigarettes.  He has been smoking about 1.00 pack per day. He has never used smokeless tobacco. He reports that  drinks alcohol. He reports that he uses illicit drugs (Marijuana, Heroin, and IV) about 3 times per week.  Additional Social History:  Alcohol / Drug Use Pain Medications: None  Prescriptions: None  Over the Counter: None  History of alcohol / drug use?: Yes Longest period of sobriety (when/how long): None  Negative Consequences of Use: Financial;Personal relationships;Work / Programmer, multimedia Withdrawal Symptoms: Sweats Substance #1 Name of Substance 1: Alcohol  1 - Age of First Use: 16 YOM  1 - Amount (size/oz): 12 Pk  1 -  Frequency: Daily  1 - Duration: On-going  1 - Last Use / Amount: 06/20/13 Substance #2 Name of Substance 2: Heroin  2 - Age of First Use: 23 YOM 2 - Amount (size/oz): 1 Gram  2 - Frequency: Daily  2 - Duration: On-going  2 - Last Use / Amount: 06/20/13  CIWA: CIWA-Ar BP: 111/56 mmHg Pulse Rate: 65 Nausea and Vomiting: no nausea and no vomiting Tactile Disturbances: none Tremor: no tremor Auditory Disturbances: not present Paroxysmal Sweats: two Visual Disturbances: not present Anxiety: no anxiety, at ease Headache, Fullness in Head: none present Agitation: normal activity Orientation and Clouding of Sensorium: oriented and can do serial additions CIWA-Ar Total: 5 COWS: Clinical Opiate Withdrawal Scale (COWS) Resting Pulse Rate: Pulse Rate 80 or below Sweating: Subjective report of chills or flushing Restlessness: Able to sit still Pupil Size: Pupils pinned or normal size for room light Bone or Joint Aches: Not present Runny Nose or Tearing: Not present GI Upset: No GI symptoms Tremor: No tremor Yawning: No yawning Anxiety or Irritability: None Gooseflesh Skin: Skin is smooth COWS Total Score: 1  Allergies: No Known Allergies  Home Medications:  (Not in a hospital admission)  OB/GYN Status:  No LMP for male patient.  General Assessment Data Location of Assessment: [facility] ED Is this a Tele or Face-to-Face Assessment?: Tele Assessment Is this an Initial Assessment or a Re-assessment for this encounter?: Initial  Assessment Living Arrangements: Alone Can pt return to current living arrangement?: Yes Admission Status: Voluntary Is patient capable of signing voluntary admission?: Yes Transfer from: Acute Hospital Referral Source: MD  Medical Screening Exam [facility]) Medical Exam completed: No Reason for MSE not completed: Other: (None )  [facility] Crisis Care Plan Living Arrangements: Alone Name of Psychiatrist: None  Name of Therapist: None   Education  Status Is patient currently in school?: No Current Grade: None  Highest grade of school patient has completed: None  Name of school: None  Contact person: None   Risk to self Suicidal Ideation: No Suicidal Intent: No Is patient at risk for suicide?: No Suicidal Plan?: No Access to Means: No What has been your use of drugs/alcohol within the last 12 months?: Abusing: alcohol, heroin  Previous Attempts/Gestures: No How many times?: 0 Other Self Harm Risks: None  Triggers for Past Attempts: None known Intentional Self Injurious Behavior: None Family Suicide History: No Recent stressful life event(s): Other (Comment) (Chronic SA) Persecutory voices/beliefs?: No Depression: Yes Depression Symptoms: Guilt Substance abuse history and/or treatment for substance abuse?: Yes Suicide prevention information given to non-admitted patients: Not applicable  Risk to Others Homicidal Ideation: No Thoughts of Harm to Others: No Current Homicidal Intent: No Current Homicidal Plan: No Access to Homicidal Means: No Identified Victim: None  History of harm to others?: No Assessment of Violence: None Noted Violent Behavior Description: None  Does patient have access to weapons?: No Criminal Charges Pending?: No Does patient have a court date: No  Psychosis Hallucinations: None noted Delusions: None noted  Mental Status Report Appear/Hygiene: Disheveled Eye Contact: Good Motor Activity: Unremarkable Speech: Logical/coherent Level of Consciousness: Alert Mood: Other (Comment) (Appropriate ) Affect: Appropriate to circumstance Anxiety Level: None Thought Processes: Coherent;Relevant Judgement: Unimpaired Orientation: Person;Place;Time;Situation Obsessive Compulsive Thoughts/Behaviors: None  Cognitive Functioning Concentration: Normal Memory: Recent Intact;Remote Intact IQ: Average Insight: Fair Impulse Control: Fair Appetite: Good Weight Loss: 0 Weight Gain: 0 Sleep: No  Change Total Hours of Sleep: 6 Vegetative Symptoms: None  ADLScreening [facility] Assessment Services) Patient's cognitive ability adequate to safely complete daily activities?: Yes Patient able to express need for assistance with ADLs?: Yes Independently performs ADLs?: Yes (appropriate for developmental age)  Prior Inpatient Therapy Prior Inpatient Therapy: Yes Prior Therapy Dates: 2014 Prior Therapy Facilty/Provider(s): [facility]--did not complete the program  Reason for Treatment: Detox   Prior Outpatient Therapy Prior Outpatient Therapy: No Prior Therapy Dates: None  Prior Therapy Facilty/Provider(s): None  Reason for Treatment: None   ADL Screening (condition at time of admission) Patient's cognitive ability adequate to safely complete daily activities?: Yes Is the patient deaf or have difficulty hearing?: No Does the patient have difficulty seeing, even when wearing glasses/contacts?: No Does the patient have difficulty concentrating, remembering, or making decisions?: No Patient able to express need for assistance with ADLs?: Yes Does the patient have difficulty dressing or bathing?: No Independently performs ADLs?: Yes (appropriate for developmental age) Does the patient have difficulty walking or climbing stairs?: No Weakness of Legs: None Weakness of Arms/Hands: None  Home Assistive Devices/Equipment Home Assistive Devices/Equipment: None  Therapy Consults (therapy consults require a physician order) PT Evaluation Needed: No OT Evalulation Needed: No SLP Evaluation Needed: No Abuse/Neglect Assessment (Assessment to be complete while patient is alone) Physical Abuse: Denies Verbal Abuse: Denies Sexual Abuse: Denies Exploitation of patient/patient's resources: Denies Self-Neglect: Denies Values / Beliefs Cultural Requests During Hospitalization: None Spiritual Requests During Hospitalization: None Consults Spiritual Care Consult Needed: No  Social Work Consult Needed:  No Merchant navy officer (For Healthcare) Advance Directive: Patient does not have advance directive;Patient would not like information Pre-existing out of facility DNR order (yellow form or pink MOST form): No Nutrition Screen- [facility] Adult/[facility]/AP Patient's home diet: Regular  Additional Information 1:1 In Past 12 Months?: No CIRT Risk: No Elopement Risk: No Does patient have medical clearance?: Yes     Disposition:  Disposition Initial Assessment Completed for this Encounter: Yes Disposition of Patient: Referred to;Other dispositions ([facility] or other placement ) Type of inpatient treatment program: Adult Patient referred to: [facility]  On Site Evaluation by:   Reviewed with Physician:    Murrell Redden 06/21/2013 3:29 AM

## 2013-06-21 NOTE — BHH Counselor (Signed)
This pt has been referred to [facility] to for possible inpt treatment, per Elberta Fortis., facility will not take him back(Devon [name]) because he does not complete program; has been to [facility] more than once.  [facility] does not have any [place] sponsored beds.  Pt does not have any present in [facility].  This Clinical research associate faxed referrals to [facility] to Elberta Fortis to provide to pt and this Clinical research associate contacted Dr. Ty Hilts) and discussed disposition.  EDP will complete final disposition.

## 2013-06-21 NOTE — Progress Notes (Signed)
B.Chandria Rookstool, [facility] completed placement search for patient who is seeking detox. Writer contacted [facility] again on this am still reporting no [place] detox beds. Contacted Adolf at [facility] in [place] who reports a decline on patient due to not completing program previously. [facility] does no have a program for opioid withdrawals. Patient has been given a list of resources for detox and residential treatment. Patient will be reviewed by attending physician for discharge.

## 2013-06-21 NOTE — ED Notes (Addendum)
TTS completed. 

## 2013-10-05 ENCOUNTER — Emergency Department (HOSPITAL_COMMUNITY)
Admission: EM | Admit: 2013-10-05 | Discharge: 2013-10-05 | Disposition: A | Discharge status: Home / Self Care | Payer: PRIVATE HEALTH INSURANCE | Attending: Emergency Medicine | Admitting: Emergency Medicine

## 2013-10-05 DIAGNOSIS — J45909 Unspecified asthma, uncomplicated: Secondary | ICD-10-CM | POA: Insufficient documentation

## 2013-10-05 DIAGNOSIS — F17200 Nicotine dependence, unspecified, uncomplicated: Secondary | ICD-10-CM | POA: Insufficient documentation

## 2013-10-05 DIAGNOSIS — F1110 Opioid abuse, uncomplicated: Secondary | ICD-10-CM

## 2013-10-05 DIAGNOSIS — F1010 Alcohol abuse, uncomplicated: Secondary | ICD-10-CM

## 2013-10-05 DIAGNOSIS — R51 Headache: Secondary | ICD-10-CM | POA: Insufficient documentation

## 2013-10-05 LAB — COMPREHENSIVE METABOLIC PANEL
Albumin: 4.4 g/dL (ref 3.5–5.2)
BUN: 15 mg/dL (ref 6–23)
Creatinine, Ser: 1.09 mg/dL (ref 0.50–1.35)
Total Bilirubin: 0.7 mg/dL (ref 0.3–1.2)
Total Protein: 8.1 g/dL (ref 6.0–8.3)

## 2013-10-05 LAB — CBC
HCT: 47.1 % (ref 39.0–52.0)
MCHC: 34.2 g/dL (ref 30.0–36.0)
MCV: 89.5 fL (ref 78.0–100.0)
RDW: 14.2 % (ref 11.5–15.5)

## 2013-10-05 LAB — RAPID URINE DRUG SCREEN, HOSP PERFORMED
Benzodiazepines: POSITIVE — AB
Cocaine: NOT DETECTED

## 2013-10-05 LAB — ETHANOL: Alcohol, Ethyl (B): <11 mg/dL (ref 0–11)

## 2013-10-05 LAB — SALICYLATE LEVEL: Salicylate Lvl: <2 mg/dL — ABNORMAL LOW (ref 2.8–20.0)

## 2013-10-05 MED ORDER — LORAZEPAM 1 MG PO TABS
1.0000 mg | ORAL_TABLET | Freq: Once | ORAL | Status: AC
  Administered 2013-10-05: 1 mg via ORAL
  Filled 2013-10-05: qty 1

## 2013-10-05 MED ORDER — LORAZEPAM 1 MG PO TABS: 0.0000 mg | ORAL_TABLET | Freq: Four times a day (QID) | ORAL | Status: DC

## 2013-10-05 MED ORDER — LORAZEPAM 1 MG PO TABS: 1.0000 mg | ORAL_TABLET | Freq: Three times a day (TID) | ORAL | Status: DC | PRN

## 2013-10-05 MED ORDER — THIAMINE HCL 100 MG/ML IJ SOLN: 100.0000 mg | Freq: Every day | INTRAMUSCULAR | Status: DC

## 2013-10-05 MED ORDER — ALUM & MAG HYDROXIDE-SIMETH 200-200-20 MG/5ML PO SUSP: 30.0000 mL | ORAL | Status: DC | PRN

## 2013-10-05 MED ORDER — VITAMIN B-1 100 MG PO TABS: 100.0000 mg | ORAL_TABLET | Freq: Every day | ORAL | Status: DC

## 2013-10-05 MED ORDER — ZOLPIDEM TARTRATE 5 MG PO TABS: 5.0000 mg | ORAL_TABLET | Freq: Every evening | ORAL | Status: DC | PRN

## 2013-10-05 MED ORDER — ACETAMINOPHEN 325 MG PO TABS: 650.0000 mg | ORAL_TABLET | ORAL | Status: DC | PRN

## 2013-10-05 MED ORDER — IBUPROFEN 200 MG PO TABS: 600.0000 mg | ORAL_TABLET | Freq: Three times a day (TID) | ORAL | Status: DC | PRN

## 2013-10-05 MED ORDER — LORAZEPAM 1 MG PO TABS: 0.0000 mg | ORAL_TABLET | Freq: Two times a day (BID) | ORAL | Status: DC

## 2013-10-05 MED ORDER — NICOTINE 21 MG/24HR TD PT24
21.0000 mg | MEDICATED_PATCH | Freq: Every day | TRANSDERMAL | Status: DC
  Administered 2013-10-05: 21 mg via TRANSDERMAL
  Filled 2013-10-05 (×2): qty 1

## 2013-10-05 MED ORDER — NICOTINE 21 MG/24HR TD PT24: 21.0000 mg | MEDICATED_PATCH | Freq: Every day | TRANSDERMAL | Status: DC

## 2013-10-05 MED ORDER — ONDANSETRON HCL 4 MG PO TABS: 4.0000 mg | ORAL_TABLET | Freq: Three times a day (TID) | ORAL | Status: DC | PRN

## 2013-10-05 NOTE — ED Provider Notes (Signed)
CSN: [id number]     Arrival date & time 10/05/13  1547 History  This chart was scribed for non-physician practitioner, Trixie Dredge, PA-C working with Rolan Bucco, MD by Greggory Stallion, ED scribe. This patient was seen in room [facility]TR4/[facility]PT4 and the patient's care was started at 5:13 PM.   Chief Complaint  Patient presents with  . Medical Clearance   The history is provided by the patient. No language interpreter was used.   HPI Comments: Devon [name] is a 27 y.o. male who presents to the Emergency Department for medical clearance. Pt wants detox from heroin. He states he has a bed with [facility] in [place] but needs to be medically cleared first. Pt has been using heroin for 3-4 years. His last use was around 2 AM today. He has been detoxed from it before but only for about 30 days. Pt states he also drinks a large amount of alcohol. States it's about one six pack per day. His last drink was two days ago. Denies SI/HI. Pt states he currently has a worsening headache that started a few hours ago. Denies nausea, emesis, diarrhea, aching.   Past Medical History  Diagnosis Date  . Asthma   . Poly-drug misuser     heroin, marj, benzo, etoh,    Past Surgical History  Procedure Laterality Date  . Orif clavicle fracture  2011   No family history on file. History  Substance Use Topics  . Smoking status: Current Every Day Smoker -- 1.00 packs/day    Types: Cigarettes  . Smokeless tobacco: Never Used  . Alcohol Use: 0.0 oz/week     Comment: daily use    Review of Systems  Gastrointestinal: Negative for nausea, vomiting and diarrhea.  Neurological: Positive for headaches.  Psychiatric/Behavioral: Negative for suicidal ideas.  All other systems reviewed and are negative.    Allergies  Review of patient's allergies indicates no known allergies.  Home Medications  No current outpatient prescriptions on file.  There were no vitals taken for this visit.  Physical Exam  Nursing note and  vitals reviewed. Constitutional: He appears well-developed and well-nourished. No distress.  HENT:  Head: Normocephalic and atraumatic.  Neck: Neck supple.  Cardiovascular: Normal rate, regular rhythm and intact distal pulses.   Pulmonary/Chest: Effort normal and breath sounds normal. No respiratory distress. He has no wheezes. He has no rales.  Abdominal: Soft. He exhibits no distension and no mass. There is no tenderness. There is no rebound and no guarding.  Neurological: He is alert. He exhibits normal muscle tone.  Skin: He is not diaphoretic.    ED Course  Procedures (including critical care time)    COORDINATION OF CARE: 5:15 PM-Discussed treatment plan which includes tylenol and labs with pt at bedside and pt agreed to plan.   Labs Review Labs Reviewed  COMPREHENSIVE METABOLIC PANEL - Abnormal; Notable for the following:    Glucose, Bld 104 (*)    AST 54 (*)    ALT 121 (*)    All other components within normal limits  SALICYLATE LEVEL - Abnormal; Notable for the following:    Salicylate Lvl <2.0 (*)    All other components within normal limits  URINE RAPID DRUG SCREEN (HOSP PERFORMED) - Abnormal; Notable for the following:    Opiates POSITIVE (*)    Benzodiazepines POSITIVE (*)    Tetrahydrocannabinol POSITIVE (*)    All other components within normal limits  ACETAMINOPHEN LEVEL  CBC  ETHANOL   Imaging Review No  results found.  EKG Interpretation   None      Filed Vitals:   10/05/13 1908  BP: 123/79  Pulse: 81  Temp:      MDM   1. Heroin abuse   2. Alcohol abuse    Pt presents for detox from heroin and alcohol.  Was told by [facility] that there was  A bed available to him.  Discussed with Reita Cliche, [facility], who is working on placement.  No SI, HI.  Pt is voluntary.    I personally performed the services described in this documentation, which was scribed in my presence. The recorded information has been reviewed and is accurate.   Trixie Dredge, PA-C 10/05/13  2011

## 2013-10-05 NOTE — ED Notes (Signed)
Pt states that he has a bed with [facility] in [place] and he needs to be medically cleared. Detox off of heroin. Denies SI/HI.

## 2013-10-05 NOTE — ED Notes (Signed)
Report called to [employer],  Pending transport by family to [facility].

## 2013-10-05 NOTE — ED Notes (Signed)
Spoke with [facility] and they have beds available.  Sts they need a prescreen form for approval.  They are faxing over the form.

## 2013-10-05 NOTE — BH Assessment (Signed)
Assessment Note  Devon [name] is an 27 y.o. male.   Pt using approximately 100 dollars  OR 1 gram of heroin per day.  Pt also consuming alcohol daily.  Last use of alcohol was 10-04-13.  Last use of heroin was 10-05-13.    Pt denies suicidal or homicidal ideation, intent.  Pt denies hearing voices or experiencing hallucinations related to psychiatric related reasons.    Pt does admit to depression but appears to be situational.  Pt reports he is tired of using drugs and wants to stop.  Pt reports being discouraged and mild depression.    Pt has friend who can transport him to detox center.  After detox pt has plans to be admitted to [facility] for rehab.    Pt has been to [facility] in [place] before and believes it a good program and wants to return.    MSE by [facility]:  Fair eye contact, polite, cooperative, Ox3, judgment impaired with SA use but not in regards to safety/harm of self/others, speech clear, restless, anxious, picking at skin somewhat.  Recommendation  [facility] to be sent information for review.    Pt is of the understanding since he spoke to someone at [facility] that he is accepted and can go once medically cleared.  [facility]S attempted to explain it is a process and pt may be in ED for hours depending on how his information is processed at [facility].  Pt was polite but did not understand and therefore may need reassurance.There is no guarantee he will be accepted but paper work will be sent to [facility] for their review.   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 Asthma   . Poly-drug misuser     heroin, marj, benzo, etoh,    Axis IV: other psychosocial or environmental problems, problems related to social environment and problems with primary support group Axis V: 51-60 moderate symptoms  Past Medical History:  Past Medical History  Diagnosis Date  . Asthma   . Poly-drug misuser     heroin, marj, benzo, etoh,      Past Surgical History  Procedure Laterality Date  . Orif clavicle fracture  2011    Family History: No family history on file.  Social History:  reports that he has been smoking Cigarettes.  He has been smoking about 1.00 pack per day. He has never used smokeless tobacco. He reports that he drinks alcohol. He reports that he uses illicit drugs (Marijuana, Heroin, and IV) about 3 times per week.  Additional Social History:  Alcohol / Drug Use Pain Medications: na Prescriptions: na Over the Counter: na History of alcohol / drug use?: Yes Longest period of sobriety (when/how long): months here and there Negative Consequences of Use: Financial;Personal relationships;Work / Mining engineer #1 Name of Substance 1: alcohol 1 - Age of First Use: 16 1 - Amount (size/oz): varies 1 - Frequency: consumes  anywhere between a six pack and 18 beers daily 1 - Duration: years 1 - Last Use / Amount: 10-04-13 Substance #2 Name of Substance 2: Heroin 2 - Age of First Use: 23 2 - Amount (size/oz): varies 1/2 to 1 gram 2 - Frequency: daily 2 - Duration: 3 years 2 - Last Use / Amount: 10-05-13 2am  CIWA: CIWA-Ar BP: 123/79 mmHg Pulse Rate: 81 Nausea and Vomiting: mild nausea with no vomiting Tactile Disturbances: none Tremor: not visible, but can be felt  fingertip to fingertip Auditory Disturbances: not present Paroxysmal Sweats: no sweat visible Visual Disturbances: very mild sensitivity Anxiety: two Headache, Fullness in Head: very mild Agitation: somewhat more than normal activity Orientation and Clouding of Sensorium: oriented and can do serial additions CIWA-Ar Total: 7 COWS:    Allergies: No Known Allergies  Home Medications:  (Not in a hospital admission)  OB/GYN Status:  No LMP for male patient.  General Assessment Data Location of Assessment: [facility] ACT Assessment: Yes Is this a Tele or Face-to-Face Assessment?: Face-to-Face Is this an Initial Assessment or a  Re-assessment for this encounter?: Initial Assessment Living Arrangements: Alone Can pt return to current living arrangement?: Yes Admission Status: Voluntary Is patient capable of signing voluntary admission?: Yes Transfer from: Acute Hospital Referral Source: MD  Medical Screening Exam [facility] Walk-in ONLY) Medical Exam completed: Yes  [facility] Crisis Care Plan Living Arrangements: Alone Name of Psychiatrist: na Name of Therapist: na  Education Status Is patient currently in school?: No Current Grade: na Highest grade of school patient has completed: na Name of school: na Contact person: na  Risk to self Suicidal Ideation: No Suicidal Intent: No Is patient at risk for suicide?: No, but patient needs Medical Clearance Suicidal Plan?: No What has been your use of drugs/alcohol within the last 12 months?: alcohol and heroin Previous Attempts/Gestures: No How many times?: 0 Other Self Harm Risks: 0na Triggers for Past Attempts: None known Intentional Self Injurious Behavior: None Family Suicide History: No Recent stressful life event(s): Other (Comment) (na) Persecutory voices/beliefs?: No Depression: Yes Depression Symptoms: Isolating;Fatigue;Guilt;Loss of interest in usual pleasures;Feeling worthless/self pity;Feeling angry/irritable Substance abuse history and/or treatment for substance abuse?: Yes Suicide prevention information given to non-admitted patients: Not applicable  Risk to Others Homicidal Ideation: No Thoughts of Harm to Others: No Current Homicidal Intent: No Current Homicidal Plan: No Access to Homicidal Means: No Identified Victim: na History of harm to others?: No Assessment of Violence: None Noted Violent Behavior Description: cooperative Does patient have access to weapons?: No Criminal Charges Pending?: No Does patient have a court date: No  Psychosis Hallucinations: None noted Delusions: None noted  Mental Status Report Appear/Hygiene:  Disheveled Eye Contact: Fair Motor Activity: Restlessness;Unremarkable Speech: Logical/coherent Level of Consciousness: Alert Mood: Depressed;Anxious;Sad;Worthless, low self-esteem;Ashamed/humiliated Affect: Anxious;Depressed;Sad Anxiety Level: Moderate Thought Processes: Coherent Judgement: Impaired Orientation: Person;Place;Situation;Appropriate for developmental age Obsessive Compulsive Thoughts/Behaviors: None  Cognitive Functioning Concentration: Decreased Memory: Recent Intact;Remote Intact IQ: Average Insight: Poor Impulse Control: Poor Appetite: Fair Weight Loss: 0 Weight Gain: 0 Sleep: Decreased Total Hours of Sleep: 4 Vegetative Symptoms: None  ADLScreening [facility] Assessment Services) Patient's cognitive ability adequate to safely complete daily activities?: Yes Patient able to express need for assistance with ADLs?: Yes Independently performs ADLs?: Yes (appropriate for developmental age)  Prior Inpatient Therapy Prior Inpatient Therapy: Yes Prior Therapy Dates: 2014 Prior Therapy Facilty/Provider(s): unk Reason for Treatment: detox  Prior Outpatient Therapy Prior Outpatient Therapy: Yes Prior Therapy Dates: 2014 Prior Therapy Facilty/Provider(s): unk Reason for Treatment: rehab  ADL Screening (condition at time of admission) Patient's cognitive ability adequate to safely complete daily activities?: Yes Is the patient deaf or have difficulty hearing?: No Does the patient have difficulty seeing, even when wearing glasses/contacts?: No Does the patient have difficulty concentrating, remembering, or making decisions?: No Patient able to express need for assistance with ADLs?: Yes Does the patient have difficulty dressing or bathing?: No Independently performs ADLs?: Yes (appropriate for developmental age) Does the patient have difficulty walking or climbing stairs?:  No Weakness of Legs: None Weakness of Arms/Hands: None  Home Assistive  Devices/Equipment Home Assistive Devices/Equipment: None  Therapy Consults (therapy consults require a physician order) PT Evaluation Needed: No OT Evalulation Needed: No SLP Evaluation Needed: No Abuse/Neglect Assessment (Assessment to be complete while patient is alone) Physical Abuse: Denies Verbal Abuse: Denies Sexual Abuse: Denies Exploitation of patient/patient's resources: Denies Self-Neglect: Denies Values / Beliefs Cultural Requests During Hospitalization: None Spiritual Requests During Hospitalization: None Consults Spiritual Care Consult Needed: No Social Work Consult Needed: No Merchant navy officer (For Healthcare) Advance Directive: Patient does not have advance directive Pre-existing out of facility DNR order (yellow form or pink MOST form): No    Additional Information 1:1 In Past 12 Months?: No CIRT Risk: No Elopement Risk: No Does patient have medical clearance?: Yes     Disposition:  Disposition Initial Assessment Completed for this Encounter: Yes Disposition of Patient: Referred to (detox [facility] per pt request) Patient referred to: [facility] (per pt request)  On Site Evaluation by:   Reviewed with Physician:    Titus Mould, Zayden Maffei 10/05/2013 7:57 PM

## 2013-10-05 NOTE — ED Notes (Signed)
Ava w/ [facility] faxed [facility] paperwork over to [facility].  Spoke w/ Tom w/ [facility] and he directed me to put in a consult for assessment.  Sts they will do the assessment and then fill out the paperwork.  PA made aware.

## 2013-10-05 NOTE — Progress Notes (Signed)
Completed forms for [facility] and faxed in along with vitals, labs, UDS, BAL and related info.  [facility] to follow up with [facility] at 9711 regarding pt since [facility] may or may not be near a phone.    Process takes as long as it takes due to it being squarely on [facility] to decided acceptance or not.  From [facility]TS and ED perspective, staff has done all it can do at this point.  Reassurance of the pt may be needed as he has a driver here in the ED waiting on word from [facility].

## 2013-10-05 NOTE — ED Provider Notes (Signed)
Medical screening examination/treatment/procedure(s) were performed by non-physician practitioner and as supervising physician I was immediately available for consultation/collaboration.  EKG Interpretation   None         Rolan Bucco, MD 10/05/13 2112

## 2013-10-05 NOTE — ED Notes (Signed)
Pt transferred from triage, presents for medical clearance, requesting detox from opiate abuse and alcohol abuse.  Pt reports last used Opiates last night and admits to drinking alcohol a 6 pack nightly.  Denies SI or HI, no AV hallucinations, not feeling hopeless.  Pt calm & cooperative.

## 2013-11-03 ENCOUNTER — Emergency Department (HOSPITAL_COMMUNITY)
Admission: EM | Admit: 2013-11-03 | Discharge: 2013-11-03 | Disposition: A | Discharge status: Home / Self Care | Payer: PRIVATE HEALTH INSURANCE | Attending: Emergency Medicine | Admitting: Emergency Medicine

## 2013-11-03 NOTE — ED Notes (Signed)
Pt called to room x 1 no response 

## 2016-07-21 DIAGNOSIS — F1020 Alcohol dependence, uncomplicated: Secondary | ICD-10-CM | POA: Insufficient documentation

## 2016-07-21 DIAGNOSIS — F1120 Opioid dependence, uncomplicated: Secondary | ICD-10-CM | POA: Insufficient documentation

## 2016-07-21 DIAGNOSIS — F1210 Cannabis abuse, uncomplicated: Secondary | ICD-10-CM | POA: Insufficient documentation

## 2016-07-21 DIAGNOSIS — F1420 Cocaine dependence, uncomplicated: Secondary | ICD-10-CM | POA: Insufficient documentation

## 2016-07-21 DIAGNOSIS — F1510 Other stimulant abuse, uncomplicated: Secondary | ICD-10-CM | POA: Insufficient documentation

## 2016-10-05 ENCOUNTER — Emergency Department (HOSPITAL_COMMUNITY)
Admission: EM | Admit: 2016-10-05 | Discharge: 2016-10-05 | Attending: Emergency Medicine | Admitting: Emergency Medicine

## 2016-10-05 ENCOUNTER — Encounter (HOSPITAL_COMMUNITY): Payer: Self-pay | Admitting: Emergency Medicine

## 2016-10-05 DIAGNOSIS — R21 Rash and other nonspecific skin eruption: Secondary | ICD-10-CM | POA: Insufficient documentation

## 2016-10-05 DIAGNOSIS — J45909 Unspecified asthma, uncomplicated: Secondary | ICD-10-CM | POA: Diagnosis not present

## 2016-10-05 DIAGNOSIS — F17210 Nicotine dependence, cigarettes, uncomplicated: Secondary | ICD-10-CM | POA: Diagnosis not present

## 2016-10-05 MED ORDER — DIPHENHYDRAMINE HCL 25 MG PO CAPS: 25.0000 mg | ORAL_CAPSULE | Freq: Four times a day (QID) | ORAL | 0 refills | Status: DC | PRN

## 2016-10-05 MED ORDER — IBUPROFEN 800 MG PO TABS: 800.0000 mg | ORAL_TABLET | Freq: Three times a day (TID) | ORAL | 0 refills | Status: DC | PRN

## 2016-10-05 NOTE — Discharge Instructions (Signed)
Return here as needed.  [facility]

## 2016-10-05 NOTE — ED Triage Notes (Signed)
Pt reports waking up and having hives to generalized body area. Pt uncertain of what caused hives. Pt was given Benadryl and Prednisone at jail at appx 2100 tonight. Pt reports having some relief from itching at this time.

## 2016-10-05 NOTE — ED Provider Notes (Signed)
[facility]-EMERGENCY DEPT Provider Note   CSN: [id number] Arrival date & time: 10/05/16  2126     History   Chief Complaint Chief Complaint  Patient presents with  . Urticaria    HPI Devon [name] is a 30 y.o. male.  HPI Patient presents to the emergency department with rash.  This started about 2 hours ago.  Patient states that he noticed a rash on his arms and legs with itching.  The patient states nothing seems to make her condition better or worse.  She states they gave him prednisone and Benadryl at the jail.  Patient states that he feels like the rash is gotten better.  Patient was brought in for evaluation of the symptoms.  Patient denies any new medications or other exposures that he is aware ofThe patient denies chest pain, shortness of breath, headache,blurred vision, neck pain, fever, cough, weakness, numbness, dizziness, anorexia, edema, abdominal pain, nausea, vomiting, diarrhea,  back pain, dysuria, hematemesis, bloody stool, near syncope, or syncope. Past Medical History:  Diagnosis Date  . Asthma   . Poly-drug misuser    heroin, marj, benzo, etoh,     Patient Active Problem List   Diagnosis Date Noted  . VIRAL URI 11/19/2009  . TOBACCO ABUSE 08/22/2008  . BRONCHITIS, ACUTE 08/22/2008  . ASTHMA 08/22/2008    Past Surgical History:  Procedure Laterality Date  . ORIF CLAVICLE FRACTURE  2011       Home Medications    Prior to Admission medications   Not on File    Family History History reviewed. No pertinent family history.  Social History Social History  Substance Use Topics  . Smoking status: Current Every Day Smoker    Packs/day: 1.00    Types: Cigarettes  . Smokeless tobacco: Never Used  . Alcohol use 0.0 oz/week     Comment: daily use     Allergies   Patient has no known allergies.   Review of Systems Review of Systems All other systems negative except as documented in the HPI. All pertinent positives and negatives as reviewed in  the HPI.  Physical Exam Updated Vital Signs BP 111/68 (BP Location: Right Arm)   Pulse 70   Temp 97.6 F (36.4 C) (Oral)   Resp 18   Ht 5\' 11"  (1.803 m)   Wt 77.1 kg   SpO2 100%   BMI 23.71 kg/m   Physical Exam  Constitutional: He is oriented to person, place, and time. He appears well-developed and well-nourished. No distress.  HENT:  Head: Normocephalic and atraumatic.  Eyes: Pupils are equal, round, and reactive to light.  Cardiovascular: Normal rate, regular rhythm and normal heart sounds.  Exam reveals no gallop and no friction rub.   No murmur heard. Pulmonary/Chest: Effort normal and breath sounds normal. No respiratory distress. He has no wheezes.  Musculoskeletal: Normal range of motion.  Neurological: He is alert and oriented to person, place, and time.  Skin: Skin is warm and dry. Capillary refill takes less than 2 seconds. Rash noted. He is not diaphoretic.  There is a faint macular rash noted on the forearms bilaterally.  This rash is barely visible at this point     ED Treatments / Results  Labs (all labs ordered are listed, but only abnormal results are displayed) Labs Reviewed - No data to display  EKG  EKG Interpretation None       Radiology No results found.  Procedures Procedures (including critical care time)  Medications Ordered in ED  Medications - No data to display   Initial Impression / Assessment and Plan / ED Course  I have reviewed the triage vital signs and the nursing notes.  Pertinent labs & imaging results that were available during my care of the patient were reviewed by me and considered in my medical decision making (see chart for details).  Clinical Course     Patient will be observed here in the emergency department for any worsening in his condition.  Patient most likely will be discharged back to the jail  Final Clinical Impressions(s) / ED Diagnoses   Final diagnoses:  None    New Prescriptions New  Prescriptions   No medications on file     Charlestine NightChristopher Deloyce Walthers, PA-C 10/05/16 2209    Charlynne Panderavid Hsienta Yao, MD 10/06/16 [phone]

## 2016-10-05 NOTE — ED Notes (Signed)
Bed: WTR9 Expected date:  Expected time:  Means of arrival:  Comments: 

## 2016-12-30 ENCOUNTER — Encounter (HOSPITAL_COMMUNITY): Payer: Self-pay | Admitting: Emergency Medicine

## 2016-12-30 ENCOUNTER — Emergency Department (HOSPITAL_COMMUNITY)
Admission: EM | Admit: 2016-12-30 | Discharge: 2016-12-30 | Disposition: A | Discharge status: Home / Self Care | Payer: PRIVATE HEALTH INSURANCE | Attending: Emergency Medicine | Admitting: Emergency Medicine

## 2016-12-30 DIAGNOSIS — J45909 Unspecified asthma, uncomplicated: Secondary | ICD-10-CM | POA: Insufficient documentation

## 2016-12-30 DIAGNOSIS — F1920 Other psychoactive substance dependence, uncomplicated: Secondary | ICD-10-CM

## 2016-12-30 DIAGNOSIS — F17210 Nicotine dependence, cigarettes, uncomplicated: Secondary | ICD-10-CM | POA: Insufficient documentation

## 2016-12-30 DIAGNOSIS — F1120 Opioid dependence, uncomplicated: Secondary | ICD-10-CM | POA: Insufficient documentation

## 2016-12-30 DIAGNOSIS — Z79899 Other long term (current) drug therapy: Secondary | ICD-10-CM | POA: Insufficient documentation

## 2016-12-30 LAB — CBG MONITORING, ED: Glucose-Capillary: 98 mg/dL (ref 65–99)

## 2016-12-30 NOTE — ED Notes (Signed)
Pt down to 82% while asleep- fine while awake.  O2 applied.

## 2016-12-30 NOTE — ED Triage Notes (Signed)
Pt comes to ed, via ems, c/o drug detox ( heroin, meth )  Complain free. Anxiety, and long time user. Pt is between homes. Iv in 20 left hand. kvo 200 nacl. V/s 117/74. Hr 109, 98 room air, 18 rr, [id number]. [place] walking around asking help.

## 2016-12-30 NOTE — ED Notes (Signed)
Bed: RESA Expected date:  Expected time:  Means of arrival:  Comments: Heroin and meth detox

## 2016-12-30 NOTE — ED Provider Notes (Signed)
[facility]-EMERGENCY DEPT Provider Note   CSN: [id number] Arrival date & time: 12/30/16  1933     History   Chief Complaint Chief Complaint  Patient presents with  . Drug Problem    HPI Devon [name] is a 31 y.o. male.  HPI   31 year old male with past medical history of chronic drug abuse who presents via EMS after they were called due to patient asking for help outside. Patient states that he was supposed to be buying heroin with a friend earlier today. He was initially given a "bad batch" so he returned to his dealer. He is given an additional package which he injected. He states he immediately felt extremely fatigued and "out of it." He states it was "very strong." He does not recall what else happened but reportedly he was walking around outside asking fingers for help. Police were called and patient was brought to the ER for evaluation. Currently, patient is intoxicated, limiting history. No apparent trauma.  Level 5 caveat invoked as remainder of history, ROS, and physical exam limited due to patient's intoxication.   Past Medical History:  Diagnosis Date  . Asthma   . Poly-drug misuser    heroin, marj, benzo, etoh,     Patient Active Problem List   Diagnosis Date Noted  . VIRAL URI 11/19/2009  . TOBACCO ABUSE 08/22/2008  . BRONCHITIS, ACUTE 08/22/2008  . ASTHMA 08/22/2008    Past Surgical History:  Procedure Laterality Date  . ORIF CLAVICLE FRACTURE  2011       Home Medications    Prior to Admission medications   Medication Sig Start Date End Date Taking? Authorizing Provider  diphenhydrAMINE (BENADRYL) 25 mg capsule Take 1 capsule (25 mg total) by mouth every 6 (six) hours as needed. Patient not taking: Reported on 12/30/2016 10/05/16   Charlestine Nighthristopher Lawyer, PA-C  ibuprofen (ADVIL,MOTRIN) 800 MG tablet Take 1 tablet (800 mg total) by mouth every 8 (eight) hours as needed. Patient not taking: Reported on 12/30/2016 10/05/16   Charlestine Nighthristopher Lawyer, PA-C     Family History No family history on file.  Social History Social History  Substance Use Topics  . Smoking status: Current Every Day Smoker    Packs/day: 1.00    Types: Cigarettes  . Smokeless tobacco: Never Used  . Alcohol use 0.0 oz/week     Comment: daily use     Allergies   Patient has no known allergies.   Review of Systems Review of Systems  Unable to perform ROS: Mental status change     Physical Exam Updated Vital Signs BP (!) 118/91 (BP Location: Right Arm)   Pulse (!) 110   Temp 97.8 F (36.6 C) (Oral)   Resp 13   SpO2 100%   Physical Exam  Constitutional: He is oriented to person, place, and time. He appears well-developed and well-nourished. No distress.  Disheveled  HENT:  Head: Normocephalic and atraumatic.  Eyes: Conjunctivae are normal.  Pinpoint pupils bilaterally  Neck: Neck supple.  Cardiovascular: Normal rate, regular rhythm and normal heart sounds.  Exam reveals no friction rub.   No murmur heard. Pulmonary/Chest: Effort normal and breath sounds normal. No respiratory distress. He has no wheezes. He has no rales.  Abdominal: He exhibits no distension.  Musculoskeletal: He exhibits no edema.  Neurological: He is alert and oriented to person, place, and time. He exhibits normal muscle tone.  Skin: Skin is warm. Capillary refill takes less than 2 seconds.  Psychiatric: He has a normal mood  and affect.  Nursing note and vitals reviewed.    ED Treatments / Results  Labs (all labs ordered are listed, but only abnormal results are displayed) Labs Reviewed  CBG MONITORING, ED    EKG  EKG Interpretation None       Radiology No results found.  Procedures Procedures (including critical care time)  Medications Ordered in ED Medications - No data to display   Initial Impression / Assessment and Plan / ED Course  I have reviewed the triage vital signs and the nursing notes.  Pertinent labs & imaging results that were  available during my care of the patient were reviewed by me and considered in my medical decision making (see chart for details).     31 yo M with PMHx as above here with accidental overdose/intoxication in setting of possible contaminated heroin use. On arrival, VSS and WNL. Exam is as above, remarkable for pupillary constriction c/w opioid intoxication. No signs of airway compromise, satting well. Will monitor.  After several hours of obs, pt clinically sober, tolerating PO, and well appearing. He denies intentional OD. No SI, HI, AVH. He is alert, oriented, and appropriate. Declines further work-up. He has an outpt provider and was recently started on methadone. Counselled pt on drug abuse/cessation. Will provide with additional outpt resources. Will d/c home.  Final Clinical Impressions(s) / ED Diagnoses   Final diagnoses:  Drug abuse and dependence [facility])    New Prescriptions Discharge Medication List as of 12/30/2016 10:39 PM       Shaune Pollack, MD 12/31/16 1313

## 2016-12-30 NOTE — ED Notes (Signed)
Pt up to the bathroom and was given a sandwich and a sprite.

## 2017-07-08 ENCOUNTER — Encounter (HOSPITAL_COMMUNITY): Payer: Self-pay

## 2017-07-08 ENCOUNTER — Emergency Department (HOSPITAL_COMMUNITY)
Admission: EM | Admit: 2017-07-08 | Discharge: 2017-07-09 | Disposition: A | Discharge status: Home / Self Care | Attending: Emergency Medicine | Admitting: Emergency Medicine

## 2017-07-08 DIAGNOSIS — B349 Viral infection, unspecified: Secondary | ICD-10-CM | POA: Insufficient documentation

## 2017-07-08 DIAGNOSIS — F1193 Opioid use, unspecified with withdrawal: Secondary | ICD-10-CM | POA: Insufficient documentation

## 2017-07-08 DIAGNOSIS — O99320 Drug use complicating pregnancy, unspecified trimester: Secondary | ICD-10-CM

## 2017-07-08 DIAGNOSIS — J111 Influenza due to unidentified influenza virus with other respiratory manifestations: Secondary | ICD-10-CM

## 2017-07-08 DIAGNOSIS — R69 Illness, unspecified: Secondary | ICD-10-CM

## 2017-07-08 DIAGNOSIS — F17210 Nicotine dependence, cigarettes, uncomplicated: Secondary | ICD-10-CM | POA: Insufficient documentation

## 2017-07-08 DIAGNOSIS — F1910 Other psychoactive substance abuse, uncomplicated: Secondary | ICD-10-CM

## 2017-07-08 DIAGNOSIS — N509 Disorder of male genital organs, unspecified: Secondary | ICD-10-CM | POA: Insufficient documentation

## 2017-07-08 LAB — CBC
HEMATOCRIT: 39 % (ref 39.0–52.0)
Hemoglobin: 13 g/dL (ref 13.0–17.0)
MCH: 27.8 pg (ref 26.0–34.0)
MCHC: 33.3 g/dL (ref 30.0–36.0)
MCV: 83.3 fL (ref 78.0–100.0)
Platelets: 242 10*3/uL (ref 150–400)
RBC: 4.68 MIL/uL (ref 4.22–5.81)
RDW: 14.3 % (ref 11.5–15.5)
WBC: 6.7 10*3/uL (ref 4.0–10.5)

## 2017-07-08 NOTE — ED Triage Notes (Signed)
Pt complains of generally not feeling well since yesterday However the chief complaint on arrival was "withdrawl" Pt states he hasn't been drinking or doing any drugs Pt is very lethargic but easily aroused

## 2017-07-09 LAB — COMPREHENSIVE METABOLIC PANEL
ALBUMIN: 4.2 g/dL (ref 3.5–5.0)
ALK PHOS: 55 U/L (ref 38–126)
ALT: 30 U/L (ref 17–63)
AST: 24 U/L (ref 15–41)
Anion gap: 9 (ref 5–15)
BUN: 14 mg/dL (ref 6–20)
CO2: 27 mmol/L (ref 22–32)
CREATININE: 0.95 mg/dL (ref 0.61–1.24)
Calcium: 9.3 mg/dL (ref 8.9–10.3)
Chloride: 104 mmol/L (ref 101–111)
GFR calc Af Amer: >60 mL/min (ref >60)
GFR calc non Af Amer: >60 mL/min (ref >60)
GLUCOSE: 134 mg/dL — AB (ref 65–99)
Potassium: 3.6 mmol/L (ref 3.5–5.1)
Sodium: 140 mmol/L (ref 135–145)
TOTAL PROTEIN: 7.4 g/dL (ref 6.5–8.1)
Total Bilirubin: 0.5 mg/dL (ref 0.3–1.2)

## 2017-07-09 LAB — ETHANOL

## 2017-07-09 MED ORDER — CEFTRIAXONE SODIUM 250 MG IJ SOLR
250.0000 mg | Freq: Once | INTRAMUSCULAR | Status: AC
  Administered 2017-07-09: 250 mg via INTRAMUSCULAR
  Filled 2017-07-09: qty 250

## 2017-07-09 MED ORDER — AZITHROMYCIN 250 MG PO TABS
1000.0000 mg | ORAL_TABLET | Freq: Once | ORAL | Status: AC
  Administered 2017-07-09: 1000 mg via ORAL
  Filled 2017-07-09: qty 4

## 2017-07-09 MED ORDER — LIDOCAINE HCL (PF) 1 % IJ SOLN
INTRAMUSCULAR | Status: AC
Start: 2017-07-09 — End: 2017-07-09
  Administered 2017-07-09: 02:00:00
  Filled 2017-07-09: qty 5

## 2017-07-09 NOTE — Discharge Instructions (Signed)
Please see the health department for further workup.  Please return to the ER if your symptoms worsen; you have increased pain, fevers, chills, inability to keep any medications down, confusion. Otherwise see the outpatient doctor as requested.

## 2017-07-09 NOTE — ED Notes (Signed)
EKG given to EDP,Nanavati,MD., for review. 

## 2017-07-09 NOTE — ED Provider Notes (Signed)
[facility]-EMER[facility] DEPT Provider Note   CSN: [id number] Arrival date & time: 07/08/17  2130     History   Chief Complaint Chief Complaint  Patient presents with  . URI  . Withdrawal    HPI Devon [name] is a 31 y.o. male.  HPI Patient comes in with chief complaint cough and flulike illness. Patient has history of active IV drug abuse with heroin. Patient has been sharing needles with others. Patient reports that today he started having some flulike illness, in the community that he hangs out with has had similar symptoms so he decided to come to the ER to get checked out. Patient reports subjective fevers and chills and malaise. Patient denies any upper respiratory infection like symptoms. Patient also reports seeing a new rash to his genital region. The rash is painful and itchy. Patient denies any pain with urination, penile discharge, history of STD, or unprotected intercourse.  Past Medical History:  Diagnosis Date  . Asthma   . Poly-drug misuser (HCC)    heroin, marj, benzo, etoh,     Patient Active Problem List   Diagnosis Date Noted  . VIRAL URI 11/19/2009  . TOBACCO ABUSE 08/22/2008  . BRONCHITIS, ACUTE 08/22/2008  . ASTHMA 08/22/2008    Past Surgical History:  Procedure Laterality Date  . ORIF CLAVICLE FRACTURE  2011       Home Medications    Prior to Admission medications   Not on File    Family History History reviewed. No pertinent family history.  Social History Social History  Substance Use Topics  . Smoking status: Current Every Day Smoker    Packs/day: 1.00    Types: Cigarettes  . Smokeless tobacco: Never Used  . Alcohol use 0.0 oz/week     Comment: daily use     Allergies   Patient has no known allergies.   Review of Systems Review of Systems  Constitutional: Positive for fatigue.  HENT: Negative for congestion.   Respiratory: Negative for shortness of breath.   Cardiovascular: Negative for chest pain.  Skin: Positive for  rash.     Physical Exam Updated Vital Signs BP 115/73 (BP Location: Left Arm)   Pulse (!) 107   Temp 98.7 F (37.1 C) (Oral)   Resp 18   Ht  (1.778 m)   Wt 58.6 kg (129 lb 1.6 oz)   SpO2 100%   BMI 18.52 kg/m   Physical Exam  Constitutional: He is oriented to person, place, and time. He appears well-developed.  HENT:  Head: Atraumatic.  Neck: Neck supple.  Cardiovascular: Normal rate and intact distal pulses.   No murmur heard. Pulmonary/Chest: Effort normal.  Genitourinary:  Genitourinary Comments: Pt has an ulcerated lesion by the base of the penis in the inguinal region on the left side. Positive left femoral lymphadenopathy.    Neurological: He is oriented to person, place, and time.  Skin: Skin is warm.  Patient has a maculopapular rash to the face. No Janeway lesions or osler notes.   Nursing note and vitals reviewed.    ED Treatments / Results  Labs (all labs ordered are listed, but only abnormal results are displayed) Labs Reviewed  COMPREHENSIVE METABOLIC PANEL - Abnormal; Notable for the following:       Result Value   Glucose, Bld 134 (*)    All other components within normal limits  ETHANOL  CBC  RAPID URINE DRUG SCREEN, HOSP PERFORMED  HIV ANTIBODY (ROUTINE TESTING)  RPR  EKG  EKG Interpretation None       Radiology No results found.  Procedures Procedures (including critical care time)  Medications Ordered in ED Medications  cefTRIAXone (ROCEPHIN) injection 250 mg (not administered)  azithromycin (ZITHROMAX) tablet 1,000 mg (not administered)     Initial Impression / Assessment and Plan / ED Course  I have reviewed the triage vital signs and the nursing notes.  Pertinent labs & imaging results that were available during my care of the patient were reviewed by me and considered in my medical decision making (see chart for details).     Patient with history of IV drug abuse comes in with chief complaint of flulike  illness and genital lesion. Patient admits to sharing needles with people who have been afebrile diseases. I'm concerned that patient might have picked up communicable conditons like HIV, hepatitis.   NO clear evidence of endocarditis based on her exam. Patient is not toxic appearing, middle murmurs appreciated, no fevers here, white count is normal.  The genital lesion is painful, therefore differential diagnoses would include conditions like herpes and chancroid. Patient vehemently denies unprotected intercourse. Still I think it is better to cover patient for likely chancroid. I informed the patient to keep an eye on the rash and to return to the ER if it's worsening. Patient has also been advised to follow-up with the health department. Optimal care.  Consultation her support specialist has been placed.  Final Clinical Impressions(s) / ED Diagnoses   Final diagnoses:  Influenza-like illness  IVDA (intravenous drug abuse) complicating pregnancy (HCC)    New Prescriptions New Prescriptions   No medications on file     Derwood Kaplan, MD 07/09/17 [phone]

## 2017-08-19 ENCOUNTER — Telehealth (HOSPITAL_COMMUNITY): Payer: Self-pay

## 2018-09-06 ENCOUNTER — Emergency Department (HOSPITAL_COMMUNITY)
Admission: EM | Admit: 2018-09-06 | Discharge: 2018-09-06 | Disposition: A | Discharge status: Home / Self Care | Attending: Emergency Medicine | Admitting: Emergency Medicine

## 2018-09-06 ENCOUNTER — Other Ambulatory Visit: Payer: Self-pay

## 2018-09-06 ENCOUNTER — Encounter (HOSPITAL_COMMUNITY): Payer: Self-pay

## 2018-09-06 DIAGNOSIS — F1510 Other stimulant abuse, uncomplicated: Secondary | ICD-10-CM | POA: Insufficient documentation

## 2018-09-06 DIAGNOSIS — F4323 Adjustment disorder with mixed anxiety and depressed mood: Secondary | ICD-10-CM | POA: Diagnosis present

## 2018-09-06 DIAGNOSIS — F329 Major depressive disorder, single episode, unspecified: Secondary | ICD-10-CM | POA: Insufficient documentation

## 2018-09-06 DIAGNOSIS — F1120 Opioid dependence, uncomplicated: Secondary | ICD-10-CM | POA: Insufficient documentation

## 2018-09-06 DIAGNOSIS — J45909 Unspecified asthma, uncomplicated: Secondary | ICD-10-CM | POA: Insufficient documentation

## 2018-09-06 DIAGNOSIS — F32A Depression, unspecified: Secondary | ICD-10-CM

## 2018-09-06 DIAGNOSIS — F17210 Nicotine dependence, cigarettes, uncomplicated: Secondary | ICD-10-CM | POA: Insufficient documentation

## 2018-09-06 HISTORY — DX: Depression, unspecified: F32.A

## 2018-09-06 HISTORY — DX: Major depressive disorder, single episode, unspecified: F32.9

## 2018-09-06 LAB — RAPID URINE DRUG SCREEN, HOSP PERFORMED
AMPHETAMINES: POSITIVE — AB
Barbiturates: NOT DETECTED
Benzodiazepines: NOT DETECTED
Cocaine: NOT DETECTED
Opiates: POSITIVE — AB
Tetrahydrocannabinol: NOT DETECTED

## 2018-09-06 LAB — CBC WITH DIFFERENTIAL/PLATELET
Abs Immature Granulocytes: 0.01 10*3/uL (ref 0.00–0.07)
Basophils Absolute: 0 10*3/uL (ref 0.0–0.1)
Basophils Relative: 0 %
Eosinophils Absolute: 0.2 10*3/uL (ref 0.0–0.5)
Eosinophils Relative: 4 %
HCT: 38.3 % — ABNORMAL LOW (ref 39.0–52.0)
Hemoglobin: 12.4 g/dL — ABNORMAL LOW (ref 13.0–17.0)
Immature Granulocytes: 0 %
Lymphocytes Relative: 42 %
Lymphs Abs: 2.3 10*3/uL (ref 0.7–4.0)
MCH: 27.9 pg (ref 26.0–34.0)
MCHC: 32.4 g/dL (ref 30.0–36.0)
MCV: 86.3 fL (ref 80.0–100.0)
Monocytes Absolute: 0.5 10*3/uL (ref 0.1–1.0)
Monocytes Relative: 9 %
NRBC: 0 % (ref 0.0–0.2)
Neutro Abs: 2.5 10*3/uL (ref 1.7–7.7)
Neutrophils Relative %: 45 %
Platelets: 281 10*3/uL (ref 150–400)
RBC: 4.44 MIL/uL (ref 4.22–5.81)
RDW: 13.7 % (ref 11.5–15.5)
WBC: 5.5 10*3/uL (ref 4.0–10.5)

## 2018-09-06 LAB — COMPREHENSIVE METABOLIC PANEL
ALT: 58 U/L — ABNORMAL HIGH (ref 0–44)
AST: 47 U/L — ABNORMAL HIGH (ref 15–41)
Albumin: 4 g/dL (ref 3.5–5.0)
Alkaline Phosphatase: 42 U/L (ref 38–126)
Anion gap: 8 (ref 5–15)
BUN: 17 mg/dL (ref 6–20)
CO2: 24 mmol/L (ref 22–32)
Calcium: 9 mg/dL (ref 8.9–10.3)
Chloride: 106 mmol/L (ref 98–111)
Creatinine, Ser: 0.74 mg/dL (ref 0.61–1.24)
GFR calc Af Amer: >60 mL/min (ref >60)
GFR calc non Af Amer: >60 mL/min (ref >60)
Glucose, Bld: 119 mg/dL — ABNORMAL HIGH (ref 70–99)
Potassium: 4.1 mmol/L (ref 3.5–5.1)
Sodium: 138 mmol/L (ref 135–145)
Total Bilirubin: 0.7 mg/dL (ref 0.3–1.2)
Total Protein: 6.9 g/dL (ref 6.5–8.1)

## 2018-09-06 NOTE — [facility] Suicide Risk Assessment (Signed)
Suicide Risk Assessment  Discharge Assessment   [facility]   Principal Problem: Adjustment disorder with mixed anxiety and depressed mood Discharge Diagnoses: Principal Problem:   Adjustment disorder with mixed anxiety and depressed mood   Total Time spent with patient: 45 minutes  Musculoskeletal: Strength & Muscle Tone: within normal limits Gait & Station: normal Patient leans: N/A  Psychiatric Specialty Exam:   Blood pressure 118/73, pulse 90, temperature 98.1 F (36.7 C), temperature source Oral, resp. rate 16, height 5\' 11"  (1.803 m), weight 77.1 kg, SpO2 100 %.Body mass index is 23.71 kg/m.  General Appearance: Casual  Eye Contact::  Good  Speech:  Normal Rate409  Volume:  Normal  Mood:  Anxious and Depressed, mild  Affect:  Congruent  Thought Process:  Coherent and Descriptions of Associations: Intact  Orientation:  Full (Time, Place, and Person)  Thought Content:  WDL and Logical  Suicidal Thoughts:  No  Homicidal Thoughts:  No  Memory:  Immediate;   Good Recent;   Good Remote;   Good  Judgement:  Fair  Insight:  Fair  Psychomotor Activity:  Normal  Concentration:  Good  Recall:  Good  Fund of Knowledge:Good  Language: Good  Akathisia:  No  Handed:  Right  AIMS (if indicated):     Assets:  Leisure Time Physical Health Resilience Social Support  Sleep:     Cognition: WNL  ADL's:  Intact   Mental Status Per Nursing Assessment::   On Admission:   32 yo male who presented to the ED with passive suicidal ideations.  When he spoke to this provider and counselor, he was here because he heard he could get a continuation for his court date tomorrow if he came here.  He does not want the judge scheduled tomorrow.  When he was told by [employer] that his date would not be continued and it would be a FTA,he apologized for "wasting our time" and denies suicidal ideation, all court related.  Stable for discharge.  Demographic Factors:  Male and  Caucasian  Loss Factors: Legal issues  Historical Factors: NA  Risk Reduction Factors:   Sense of responsibility to family, Living with another person, especially a relative and Positive social support  Continued Clinical Symptoms:  Anxiety, depression mild  Cognitive Features That Contribute To Risk:  None    Suicide Risk:  Minimal: No identifiable suicidal ideation.  Patients presenting with no risk factors but with morbid ruminations; may be classified as minimal risk based on the severity of the depressive symptoms    Plan Of Care/Follow-up recommendations:  Activity:  as tolerated Diet:  heart healthy diet  Aydenn Gervin, NP 09/06/2018, 1:00 PM

## 2018-09-06 NOTE — ED Provider Notes (Signed)
[facility]-EMERGENCY DEPT Provider Note   CSN: [id number] Arrival date & time: 09/06/18  1045     History   Chief Complaint Chief Complaint  Patient presents with  . Suicidal    HPI Devon [name] is a 32 y.o. male.  The history is provided by the patient and medical records. No language interpreter was used.  Mental Health Problem  Presenting symptoms: depression, suicidal thoughts and suicidal threats   Presenting symptoms: no aggressive behavior, no agitation, no hallucinations, no homicidal ideas, no paranoid behavior, no self-mutilation and no suicide attempt   Degree of incapacity (severity):  Severe Onset quality:  Gradual Timing:  Constant Progression:  Worsening Chronicity:  New Context: drug abuse and stressful life event (father passed away)   Context: not alcohol use, not noncompliant and not recent medication change   Treatment compliance:  Untreated Relieved by:  Nothing Worsened by:  Nothing Ineffective treatments:  None tried Associated symptoms: no abdominal pain, no chest pain, no fatigue and no headaches     Past Medical History:  Diagnosis Date  . Asthma   . Poly-drug misuser (HCC)    heroin, marj, benzo, etoh,     Patient Active Problem List   Diagnosis Date Noted  . VIRAL URI 11/19/2009  . TOBACCO ABUSE 08/22/2008  . BRONCHITIS, ACUTE 08/22/2008  . ASTHMA 08/22/2008    Past Surgical History:  Procedure Laterality Date  . ORIF CLAVICLE FRACTURE  2011        Home Medications    Prior to Admission medications   Not on File    Family History No family history on file.  Social History Social History   Tobacco Use  . Smoking status: Current Every Day Smoker    Packs/day: 1.00    Types: Cigarettes  . Smokeless tobacco: Never Used  Substance Use Topics  . Alcohol use: Yes    Comment: daily use  . Drug use: Yes    Frequency: 3.0 times per week    Types: Marijuana, Heroin, IV    Comment: heroin,  benzos, alcohol use     Allergies   Patient has no known allergies.   Review of Systems Review of Systems  Constitutional: Negative for activity change, chills, diaphoresis, fatigue and fever.  HENT: Negative for congestion and rhinorrhea.   Eyes: Negative for visual disturbance.  Respiratory: Negative for cough, chest tightness, shortness of breath, wheezing and stridor.   Cardiovascular: Negative for chest pain, palpitations and leg swelling.  Gastrointestinal: Negative for abdominal distention, abdominal pain, blood in stool, constipation, diarrhea, nausea and vomiting.  Genitourinary: Negative for difficulty urinating, dysuria and flank pain.  Musculoskeletal: Negative for back pain and gait problem.  Skin: Negative for rash and wound.  Neurological: Negative for dizziness, weakness, light-headedness and headaches.  Psychiatric/Behavioral: Positive for suicidal ideas. Negative for agitation, confusion, hallucinations, homicidal ideas, paranoia and self-injury.  All other systems reviewed and are negative.    Physical Exam Updated Vital Signs Ht 5\' 11"  (1.803 m)   Wt 77.1 kg   BMI 23.71 kg/m   Physical Exam  Constitutional: He is oriented to person, place, and time. He appears well-developed and well-nourished. No distress.  HENT:  Head: Normocephalic and atraumatic.  Mouth/Throat: Oropharynx is clear and moist. No oropharyngeal exudate.  Eyes: Pupils are equal, round, and reactive to light. Conjunctivae and EOM are normal.  Neck: Neck supple.  Cardiovascular: Normal rate and regular rhythm.  No murmur heard. Pulmonary/Chest: Effort normal and breath sounds  normal. No respiratory distress. He has no rales. He exhibits no tenderness.  Abdominal: Soft. There is no tenderness.  Musculoskeletal: He exhibits no edema or tenderness.  Neurological: He is alert and oriented to person, place, and time.  Skin: Skin is warm and dry. He is not diaphoretic. No erythema. No pallor.    Psychiatric: His speech is not rapid and/or pressured. He is not aggressive and not actively hallucinating. Thought content is not paranoid. He exhibits a depressed mood. He expresses suicidal ideation. He expresses no homicidal ideation. He expresses suicidal plans. He expresses no homicidal plans.  Nursing note and vitals reviewed.    ED Treatments / Results  Labs (all labs ordered are listed, but only abnormal results are displayed) Labs Reviewed  COMPREHENSIVE METABOLIC PANEL - Abnormal; Notable for the following components:      Result Value   Glucose, Bld 119 (*)    AST 47 (*)    ALT 58 (*)    All other components within normal limits  RAPID URINE DRUG SCREEN, HOSP PERFORMED - Abnormal; Notable for the following components:   Opiates POSITIVE (*)    Amphetamines POSITIVE (*)    All other components within normal limits  CBC WITH DIFFERENTIAL/PLATELET - Abnormal; Notable for the following components:   Hemoglobin 12.4 (*)    HCT 38.3 (*)    All other components within normal limits    EKG None  Radiology No results found.  Procedures Procedures (including critical care time)  Medications Ordered in ED Medications - No data to display   Initial Impression / Assessment and Plan / ED Course  I have reviewed the triage vital signs and the nursing notes.  Pertinent labs & imaging results that were available during my care of the patient were reviewed by me and considered in my medical decision making (see chart for details).     Devon [name] is a 32 y.o. male with a past medical history significant for polysubstance abuse with heroin and methamphetamines currently on methadone, asthma, and depression who presents for suicidal ideation.  Patient reports that he lost his father several months ago and is been having suicidal thoughts ever since.  He reports that his girlfriend convinced him to come in today after he reported a suicidal plan to overdose with heroin and  took up a hose to his exhaust on his car and pump it into the vehicle and having carbon monoxide poisoning as well.  He reports he is never tried to hurt himself in the past.  He denies AVH or homicidal ideation.  He has not been using any other drugs by report and has been doing well at the methadone clinic per patient.  He reports he was dosed this morning with his methadone.  He denies other complaints physically.  Next  On exam, patient's lungs are clear.  Chest is nontender.  Patient is alert and oriented.  Patient resting comfortably during exam.  Patient will screen laboratory testing however TTS will be consulted for management recommendations.  Patient is voluntary at this time, anticipate following up on TTS recommendations for suicidal ideation with a plan and access to suicide method.   Patient's CBC and CMP returned showing mild elevation in LFTs and mild anemia but otherwise reassuring.  UDS still in process.    Patient is medically cleared for further outpatient management  1:18 PM TTS reports that as they discussed with patient, he revealed that he does not have suicidal ideation today and  instead wanted to miss a court appearance.  He also reports that he used heroin this morning.  They feel that given his lack of suicidal ideation currently and lack of other complaints, he is stable for discharge home.    PT reassessed and denied SI.   Patient was discharged for outpatient management.  Patient understood return precautions for any new or worsened symptoms.   Final Clinical Impressions(s) / ED Diagnoses   Final diagnoses:  Adjustment disorder with mixed anxiety and depressed mood    ED Discharge Orders         Ordered    Increase activity slowly     09/06/18 1300    Diet - low sodium heart healthy     09/06/18 1300    Discharge instructions    Comments:  Follow up with outpatient resources   09/06/18 1300         Clinical Impression: 1. Adjustment disorder with  mixed anxiety and depressed mood     Disposition: Discharge  Condition: Good  I have discussed the results, Dx and Tx plan with the pt(& family if present). He/she/they expressed understanding and agree(s) with the plan. Discharge instructions discussed at great length. Strict return precautions discussed and pt &/or family have verbalized understanding of the instructions. No further questions at time of discharge.    There are no discharge medications for this patient.   Follow Up: [facility] [address] [id number] mc [place] [address] [phone]    [facility] [address] [place] [address] [phone] Schedule an appointment as soon as possible for a visit       Tegeler, Canary Brim, MD 09/06/18 2004

## 2018-09-06 NOTE — ED Triage Notes (Signed)
Pt c/o of suicidal ideation and plan to overdose on heroine, stating also increasing depression since Father passed away a couple months ago. Prior IV heroine user, Pt states now in Methadone clinic being given 70mg  Methadone daily. States he has not used IV drugs recently.

## 2018-09-06 NOTE — Discharge Instructions (Addendum)
To help you maintain a sober lifestyle, a substance abuse treatment program may be beneficial to you.  Contact [facility] at your earliest opportunity to ask about enrolling in their program: ° °     [facility] °     [address] °     [place], [place] [address] °     [phone] °     New patients are seen at the walk-in clinic every Tuesday from 9:00 am - 12:00 pm °

## 2018-09-06 NOTE — BH Assessment (Signed)
[facility] Assessment Progress Note  Per Nanine MeansJamison Lord, DNP, this pt does not require psychiatric hospitalization at this time.  [facility]  This has been included in pt's discharge instructions.  Pt's nurse, Casimiro NeedleMichael, has been notified.  Doylene Canninghomas Kierston Plasencia, MA Triage Specialist [phone]

## 2019-05-05 ENCOUNTER — Other Ambulatory Visit: Payer: Self-pay

## 2019-05-05 ENCOUNTER — Emergency Department (HOSPITAL_COMMUNITY)
Admission: EM | Admit: 2019-05-05 | Discharge: 2019-05-05 | Disposition: A | Discharge status: Home / Self Care | Attending: Emergency Medicine | Admitting: Emergency Medicine

## 2019-05-05 ENCOUNTER — Encounter (HOSPITAL_COMMUNITY): Payer: Self-pay | Admitting: Obstetrics and Gynecology

## 2019-05-05 DIAGNOSIS — L03114 Cellulitis of left upper limb: Secondary | ICD-10-CM

## 2019-05-05 DIAGNOSIS — J45909 Unspecified asthma, uncomplicated: Secondary | ICD-10-CM | POA: Insufficient documentation

## 2019-05-05 DIAGNOSIS — F17210 Nicotine dependence, cigarettes, uncomplicated: Secondary | ICD-10-CM | POA: Insufficient documentation

## 2019-05-05 MED ORDER — SULFAMETHOXAZOLE-TRIMETHOPRIM 800-160 MG PO TABS: 1.0000 | ORAL_TABLET | Freq: Two times a day (BID) | ORAL | 0 refills | Status: DC

## 2019-05-05 MED ORDER — SULFAMETHOXAZOLE-TRIMETHOPRIM 800-160 MG PO TABS
1.0000 | ORAL_TABLET | Freq: Once | ORAL | Status: AC
  Administered 2019-05-05: 1 via ORAL
  Filled 2019-05-05: qty 1

## 2019-05-05 MED ORDER — SULFAMETHOXAZOLE-TRIMETHOPRIM 800-160 MG PO TABS: 1.0000 | ORAL_TABLET | Freq: Two times a day (BID) | ORAL | 0 refills | Status: AC

## 2019-05-05 NOTE — ED Provider Notes (Signed)
[facility] DEPT Provider Note   CSN: [id number] Arrival date & time: 05/05/19  1956     History   Chief Complaint Chief Complaint  Patient presents with  . Rash    HPI Devon [name] is a 33 y.o. male.     HPI Patient presents with concern of a cutaneous wound on his left anterior medial elbow. Patient is an IV drug user, using heroin, as recently as yesterday. He notes that since that time is developed an area of red indurated skin in the left arm. No fever, no chills, no nausea, no vomiting, no medication taken for pain relief per He has used warm compresses, without substantial change in his condition. Patient is enrolled in a rehabilitation program, using methadone as well. Past Medical History:  Diagnosis Date  . Asthma   . Depression 09/06/2018  . 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 Adjustment disorder with mixed anxiety and depressed mood 09/06/2018  . VIRAL URI 11/19/2009  . TOBACCO ABUSE 08/22/2008  . BRONCHITIS, ACUTE 08/22/2008  . ASTHMA 08/22/2008    Past Surgical History:  Procedure Laterality Date  . ORIF CLAVICLE FRACTURE  2011        Home Medications    Prior to Admission medications   Medication Sig Start Date End Date Taking? Authorizing Provider  sulfamethoxazole-trimethoprim (BACTRIM DS) 800-160 MG tablet Take 1 tablet by mouth 2 (two) times daily for 7 days. 05/05/19 05/12/19  Carmin Muskrat, MD    Family History No family history on file.  Social History Social History   Tobacco Use  . Smoking status: Current Every Day Smoker    Packs/day: 1.00    Types: Cigarettes  . Smokeless tobacco: Never Used  Substance Use Topics  . Alcohol use: Yes    Comment: daily use  . Drug use: Yes    Frequency: 3.0 times per week    Types: Marijuana, Heroin, IV    Comment: heroin, benzos, alcohol use     Allergies   Patient has  no known allergies.   Review of Systems Review of Systems  Constitutional:       Per HPI, otherwise negative  HENT:       Per HPI, otherwise negative  Respiratory:       Per HPI, otherwise negative  Cardiovascular:       Per HPI, otherwise negative  Gastrointestinal: Negative for vomiting.  Endocrine:       Negative aside from HPI  Genitourinary:       Neg aside from HPI   Musculoskeletal:       Per HPI, otherwise negative  Skin: Positive for color change.  Neurological: Negative for syncope.  Psychiatric/Behavioral:       Per HPI     Physical Exam Updated Vital Signs BP (!) 164/83 (BP Location: Right Arm)   Pulse (!) 109   Temp 98.9 F (37.2 C) (Oral)   Resp 18   SpO2 100%   Physical Exam Vitals signs and nursing note reviewed.  Constitutional:      General: He is not in acute distress.    Appearance: He is well-developed.  HENT:     Head: Normocephalic and atraumatic.  Eyes:     Conjunctiva/sclera: Conjunctivae normal.  Cardiovascular:     Rate and Rhythm: Regular rhythm. Tachycardia present.     Pulses: Normal pulses.  Pulmonary:  Effort: Pulmonary effort is normal. No respiratory distress.     Breath sounds: No stridor.  Abdominal:     General: There is no distension.  Skin:    General: Skin is warm and dry.       Neurological:     Mental Status: He is alert and oriented to person, place, and time.      ED Treatments / Results   Procedures Procedures (including critical care time)  Medications Ordered in ED Medications  sulfamethoxazole-trimethoprim (BACTRIM DS) 800-160 MG per tablet 1 tablet (has no administration in time range)     Initial Impression / Assessment and Plan / ED Course  I have reviewed the triage vital signs and the nursing notes.  Pertinent labs & imaging results that were available during my care of the patient were reviewed by me and considered in my medical decision making (see chart for details).  Generally  well-appearing young male with history of IV drug use presents with an area of erythema, induration concerning for cellulitis. Patient has mild tachycardia, but no hypotension, no other alarming findings suggesting bacteremia, sepsis. Patient started on antibiotics here, received coupon for assistance with obtaining those as an outpatient, was discharged in stable condition.  Final Clinical Impressions(s) / ED Diagnoses   Final diagnoses:  Cellulitis of left upper extremity    ED Discharge Orders         Ordered    sulfamethoxazole-trimethoprim (BACTRIM DS) 800-160 MG tablet  2 times daily,   Status:  Discontinued     05/05/19 2118    sulfamethoxazole-trimethoprim (BACTRIM DS) 800-160 MG tablet  2 times daily     05/05/19 2119           Gerhard MunchLockwood, Rekia Kujala, MD 05/05/19 2122

## 2019-05-05 NOTE — Discharge Instructions (Signed)
As discussed, it is important that you monitor your condition carefully, obtain your prescription for antibiotics. This may cause up to $10, but hopefully less.  Please use the provided coupon. Return here for concerning changes in your condition.

## 2019-05-05 NOTE — ED Triage Notes (Signed)
Pt reports he has a heroin problem and is positive for Hep C and is working on getting clean. Pt has an area of possible infection/cellulitis on the left elbow/arm.  Pt reports he is working on getting clean and his last use was yesterday

## 2019-10-20 ENCOUNTER — Other Ambulatory Visit: Payer: Self-pay

## 2019-10-20 ENCOUNTER — Encounter (HOSPITAL_COMMUNITY): Payer: Self-pay | Admitting: Emergency Medicine

## 2019-10-20 ENCOUNTER — Emergency Department (HOSPITAL_COMMUNITY)
Admission: EM | Admit: 2019-10-20 | Discharge: 2019-10-20 | Disposition: A | Discharge status: Home / Self Care | Payer: Self-pay | Attending: Emergency Medicine | Admitting: Emergency Medicine

## 2019-10-20 DIAGNOSIS — Z5321 Procedure and treatment not carried out due to patient leaving prior to being seen by health care provider: Secondary | ICD-10-CM | POA: Insufficient documentation

## 2019-10-20 DIAGNOSIS — K920 Hematemesis: Secondary | ICD-10-CM | POA: Insufficient documentation

## 2019-10-20 LAB — URINALYSIS, ROUTINE W REFLEX MICROSCOPIC
Bilirubin Urine: NEGATIVE
Glucose, UA: NEGATIVE mg/dL
Hgb urine dipstick: NEGATIVE
Ketones, ur: 20 mg/dL — AB
Nitrite: NEGATIVE
Protein, ur: NEGATIVE mg/dL
Specific Gravity, Urine: 1.026 (ref 1.005–1.030)
pH: 6 (ref 5.0–8.0)

## 2019-10-20 MED ORDER — SODIUM CHLORIDE 0.9% FLUSH: 3.0000 mL | Freq: Once | INTRAVENOUS | Status: DC

## 2019-10-20 MED ORDER — ONDANSETRON 4 MG PO TBDP
4.0000 mg | ORAL_TABLET | Freq: Once | ORAL | Status: AC | PRN
  Administered 2019-10-20: 4 mg via ORAL
  Filled 2019-10-20: qty 1

## 2019-10-20 NOTE — ED Triage Notes (Signed)
Pt reports that he vomited blood this morning. Reports been taking lots of ibuprofen and tylenol for dental pains. Started Augmentin yesterday for dental infection.

## 2019-10-20 NOTE — ED Notes (Signed)
Call patient in the lobby to collect blood and no one responded

## 2022-01-02 ENCOUNTER — Ambulatory Visit (HOSPITAL_COMMUNITY)
Admission: EM | Admit: 2022-01-02 | Discharge: 2022-01-02 | Disposition: A | Discharge status: Home / Self Care | Payer: 59 | Attending: Family Medicine | Admitting: Family Medicine

## 2022-01-02 ENCOUNTER — Other Ambulatory Visit: Payer: Self-pay

## 2022-01-02 ENCOUNTER — Encounter (HOSPITAL_COMMUNITY): Payer: Self-pay

## 2022-01-02 DIAGNOSIS — R61 Generalized hyperhidrosis: Secondary | ICD-10-CM

## 2022-01-02 DIAGNOSIS — F1190 Opioid use, unspecified, uncomplicated: Secondary | ICD-10-CM

## 2022-01-02 MED ORDER — OXYBUTYNIN CHLORIDE 5 MG PO TABS: 5.0000 mg | ORAL_TABLET | Freq: Four times a day (QID) | ORAL | 0 refills | Status: AC

## 2022-01-02 NOTE — ED Provider Notes (Signed)
?[facility] ? ? ? ?CSN: [id number] ?Arrival date & time: 01/02/22  1321 ? ? ?  ? ?History   ?Chief Complaint ?Chief Complaint  ?Patient presents with  ? Excessive Sweating  ? ? ?HPI ?Devon [name] is a 36 y.o. male.  ? ?Patient is here for excessive sweating.  He is on methadone, and thinks that is the cause.  He has had to call out of work due to the sweating.  He has excessive sweating mostly on his head and face and chest area.  ?A friend of his was given oxybutynin for methadone associated sweating and this was helpful.  He wonders if he could use this.  ?He also has bad anxiety, and wondered if that was the culprit.  ? ?Past Medical History:  ?Diagnosis Date  ? Asthma   ? Depression 09/06/2018  ? Poly-drug misuser   ? heroin, marj, benzo, etoh,   ? ? ?Patient Active Problem List  ? Diagnosis Date Noted  ? Adjustment disorder with mixed anxiety and depressed mood 09/06/2018  ? VIRAL URI 11/19/2009  ? TOBACCO ABUSE 08/22/2008  ? BRONCHITIS, ACUTE 08/22/2008  ? ASTHMA 08/22/2008  ? ? ?Past Surgical History:  ?Procedure Laterality Date  ? ORIF CLAVICLE FRACTURE  2011  ? ? ? ? ? ?Home Medications   ? ?Prior to Admission medications   ?Not on File  ? ? ?Family History ?History reviewed. No pertinent family history. ? ?Social History ?Social History  ? ?Tobacco Use  ? Smoking status: Every Day  ?  Packs/day: 1.00  ?  Types: Cigarettes  ? Smokeless tobacco: Never  ?Substance Use Topics  ? Alcohol use: Yes  ?  Comment: daily use  ? Drug use: Yes  ?  Frequency: 3.0 times per week  ?  Types: Marijuana, Heroin, IV  ?  Comment: heroin, benzos, alcohol use  ? ? ? ?Allergies   ?Patient has no known allergies. ? ? ?Review of Systems ?Review of Systems  ?Constitutional: Negative.   ?HENT: Negative.    ?Respiratory: Negative.    ?Cardiovascular: Negative.   ?Gastrointestinal: Negative.   ?Genitourinary: Negative.   ?Musculoskeletal: Negative.   ? ? ?Physical Exam ?Triage Vital Signs ?ED Triage Vitals  ?Enc Vitals  Group  ?   BP 01/02/22 1448 137/87  ?   Pulse Rate 01/02/22 1448 (!) 115  ?   Resp 01/02/22 1448 17  ?   Temp 01/02/22 1448 98.1 ?F (36.7 ?C)  ?   Temp Source 01/02/22 1448 Oral  ?   SpO2 01/02/22 1448 97 %  ?   Weight --   ?   Height --   ?   Head Circumference --   ?   Peak Flow --   ?   Pain Score 01/02/22 1447 0  ?   Pain Loc --   ?   Pain Edu? --   ?   Excl. in GC? --   ? ?No data found. ? ?Updated Vital Signs ?BP 137/87 (BP Location: Right Arm)   Pulse (!) 115   Temp 98.1 ?F (36.7 ?C) (Oral)   Resp 17   SpO2 97%  ? ?Visual Acuity ?Right Eye Distance:   ?Left Eye Distance:   ?Bilateral Distance:   ? ?Right Eye Near:   ?Left Eye Near:    ?Bilateral Near:    ? ?Physical Exam ?Constitutional:   ?   Appearance: Normal appearance.  ?HENT:  ?   Head: Normocephalic.  ?Cardiovascular:  ?  Rate and Rhythm: Normal rate and regular rhythm.  ?Pulmonary:  ?   Effort: Pulmonary effort is normal.  ?   Breath sounds: Normal breath sounds.  ?Musculoskeletal:  ?   Cervical back: Normal range of motion.  ?Neurological:  ?   Mental Status: He is alert.  ? ? ? ?UC Treatments / Results  ?Labs ?(all labs ordered are listed, but only abnormal results are displayed) ?Labs Reviewed - No data to display ? ?EKG ? ? ?Radiology ?No results found. ? ?Procedures ?Procedures (including critical care time) ? ?Medications Ordered in UC ?Medications - No data to display ? ?Initial Impression / Assessment and Plan / UC Course  ?I have reviewed the triage vital signs and the nursing notes. ? ?Pertinent labs & imaging results that were available during my care of the patient were reviewed by me and considered in my medical decision making (see chart for details). ? ?Patient was seen today c/o excessive sweating due to methadone.  He has had this problem for years.  A friend was given oxybutynin with help, and he is requesting this.  ?I did see several articles on the use of oxybutynin for this treatment at 5mg  qid.  This script was given to the  patient.  Advised him to start it at twice/day, and he may work up to QID max dosing depending on his results.  He is aware he will need to see a PCP for long term medication usage.  ? ?Final Clinical Impressions(s) / UC Diagnoses  ? ?Final diagnoses:  ?Excessive sweating  ?Methadone use  ? ? ? ?Discharge Instructions   ? ?  ?You were seen today for excessive sweating likely due to methadone use.  I have sent out oxybutynin 5mg  to take up to four times/day to help with your symptoms.  If this does not help, or you have side affects that you may not like, then please stop the medication.  Otherwise, please make an appointment with a primary care provider for further discussion and treatment.  You may find a primary care provider at .  ? ? ? ?ED Prescriptions   ? ? Medication Sig Dispense Auth. Provider  ? oxybutynin (DITROPAN) 5 MG tablet Take 1 tablet (5 mg total) by mouth 4 (four) times daily. 120 tablet , MD  ? ?  ? ?PDMP not reviewed this encounter. ?  [email or web address], MD ?01/02/22 1509 ? ?

## 2022-01-02 NOTE — ED Triage Notes (Signed)
Pt reports excessive sweating while being on Methadone. States he wants a medication that will help with sweating.  ? ? ?

## 2022-01-02 NOTE — Discharge Instructions (Signed)
You were seen today for excessive sweating likely due to methadone use.  I have sent out oxybutynin 5mg  to take up to four times/day to help with your symptoms.  If this does not help, or you have side affects that you may not like, then please stop the medication.  Otherwise, please make an appointment with a primary care provider for further discussion and treatment.  You may find a primary care provider at .  ?

## 2022-03-06 ENCOUNTER — Ambulatory Visit (INDEPENDENT_AMBULATORY_CARE_PROVIDER_SITE_OTHER): Payer: 59 | Admitting: Emergency Medicine

## 2022-03-06 ENCOUNTER — Encounter: Payer: Self-pay | Admitting: Emergency Medicine

## 2022-03-06 VITALS — BP 110/76 | HR 77 | Temp 98.8°F | Ht 71.0 in | Wt 217.1 lb

## 2022-03-06 DIAGNOSIS — B182 Chronic viral hepatitis C: Secondary | ICD-10-CM | POA: Insufficient documentation

## 2022-03-06 DIAGNOSIS — F1120 Opioid dependence, uncomplicated: Secondary | ICD-10-CM | POA: Diagnosis not present

## 2022-03-06 DIAGNOSIS — Z7689 Persons encountering health services in other specified circumstances: Secondary | ICD-10-CM

## 2022-03-06 DIAGNOSIS — Z23 Encounter for immunization: Secondary | ICD-10-CM | POA: Diagnosis not present

## 2022-03-06 LAB — LIPID PANEL
Cholesterol: 172 mg/dL (ref 0–200)
HDL: 44.2 mg/dL (ref >39.00)
LDL Cholesterol: 91 mg/dL (ref 0–99)
NonHDL: 128.27
Total CHOL/HDL Ratio: 4
Triglycerides: 184 mg/dL — ABNORMAL HIGH (ref 0.0–149.0)
VLDL: 36.8 mg/dL (ref 0.0–40.0)

## 2022-03-06 LAB — CBC WITH DIFFERENTIAL/PLATELET
Basophils Absolute: 0 10*3/uL (ref 0.0–0.1)
Basophils Relative: 0.2 % (ref 0.0–3.0)
Eosinophils Absolute: 0.2 10*3/uL (ref 0.0–0.7)
Eosinophils Relative: 2.5 % (ref 0.0–5.0)
HCT: 39.4 % (ref 39.0–52.0)
Hemoglobin: 13.4 g/dL (ref 13.0–17.0)
Lymphocytes Relative: 35.2 % (ref 12.0–46.0)
Lymphs Abs: 2.9 10*3/uL (ref 0.7–4.0)
MCHC: 34.1 g/dL (ref 30.0–36.0)
MCV: 83.8 fl (ref 78.0–100.0)
Monocytes Absolute: 0.5 10*3/uL (ref 0.1–1.0)
Monocytes Relative: 6.1 % (ref 3.0–12.0)
Neutro Abs: 4.6 10*3/uL (ref 1.4–7.7)
Neutrophils Relative %: 56 % (ref 43.0–77.0)
Platelets: 277 10*3/uL (ref 150.0–400.0)
RBC: 4.7 Mil/uL (ref 4.22–5.81)
RDW: 14 % (ref 11.5–15.5)
WBC: 8.2 10*3/uL (ref 4.0–10.5)

## 2022-03-06 LAB — COMPREHENSIVE METABOLIC PANEL
ALT: 58 U/L — ABNORMAL HIGH (ref 0–53)
AST: 35 U/L (ref 0–37)
Albumin: 4.8 g/dL (ref 3.5–5.2)
Alkaline Phosphatase: 59 U/L (ref 39–117)
BUN: 13 mg/dL (ref 6–23)
CO2: 32 mEq/L (ref 19–32)
Calcium: 9.9 mg/dL (ref 8.4–10.5)
Chloride: 100 mEq/L (ref 96–112)
Creatinine, Ser: 1.04 mg/dL (ref 0.40–1.50)
GFR: 93.48 mL/min (ref >60.00)
Glucose, Bld: 65 mg/dL — ABNORMAL LOW (ref 70–99)
Potassium: 3.8 mEq/L (ref 3.5–5.1)
Sodium: 139 mEq/L (ref 135–145)
Total Bilirubin: 0.5 mg/dL (ref 0.2–1.2)
Total Protein: 7.8 g/dL (ref 6.0–8.3)

## 2022-03-06 NOTE — Assessment & Plan Note (Signed)
Diagnosed 7 to 8 years ago.  Never treated.  Mostly asymptomatic. Needs evaluation by infectious diseases. Referral placed today.

## 2022-03-06 NOTE — Patient Instructions (Signed)
Hepatitis C  Hepatitis C is a liver infection that is caused by a virus. Many people have no symptoms or only mild symptoms. Hepatitis C is contagious. This means that it can spread from person to person. Over time, the infection can lead to serious liver problems. What are the causes? This condition is caused by a virus. It can spread through: Contact with body fluids from someone who has the virus. This includes: Blood. Semen or vaginal fluids. Childbirth. A woman can pass the virus to her baby during birth. Blood or organ donations that were done in the [place] before 1992. What increases the risk? Having contact with needles or syringes that have the virus on them. This may happen when you: Get acupuncture. This is a treatment that puts thin needles through your skin. Get a tattoo or body piercing. Inject drugs. Having sex with someone who has the virus. The virus can be passed through vaginal, oral, or anal sex. Getting treatment to clean your blood (kidney dialysis). Having HIV or AIDS. Having a job that puts you in contact with blood or body fluids. What are the signs or symptoms? You feel very tired. You are not hungry. You feel like you may vomit or you vomit. You have pain in your belly or fluid builds up in your belly. Your pee is dark yellow. Your skin or the white parts of your eyes look yellow (jaundice). Your skin is itchy. Your poop is light in color. You have joint pain. You bleed or bruise often. Often, there are no symptoms. How is this treated? Treatment may depend on your condition and whether you have liver damage. Treatment may include: Taking antiviral medicines and other medicines. Having follow-up treatments every 6-12 months for liver problems. Getting a new liver from a donor. This is called a liver transplant. Follow these instructions at home: Medicines Take over-the-counter and prescription medicines only as told by your doctor. If you were  given an antiviral medicine, take it as told by your doctor. Do not stop using the antiviral even if you start to feel better. Do not take any new medicines unless your doctor says that this is okay. This includes over-the-counter medicines and supplements. Activity Rest as needed. Do not have sex until your doctor says this is okay. Do not swim or use hot tubs if you have open sores or wounds. Return to your normal activities as told by your doctor. Ask your doctor what activities are safe for you. Ask your doctor when you may go back to school or work. Eating and drinking  Eat a balanced diet. Eat plenty of: Fruits and vegetables. Whole grains. Lean meats or non-meat proteins, such as beans or tofu. Drink enough fluid to keep your pee pale yellow. Do not drink alcohol. General instructions Do not share toothbrushes, nail clippers, or razors. Wash your hands often with soap and water for at least 20 seconds. If you do not have soap and water, use hand sanitizer. Cover any cuts or open sores on your skin. Keep all follow-up visits. You may need follow-up visits every 6-12 months. How is this prevented? Wash your hands often with soap and water for at least 20 seconds. Do not share needles or syringes. Use a condom every time you have vaginal, oral, or anal sex. Latex condoms should be used, if possible. Do not handle blood or body fluids without gloves or other protection. Avoid getting tattoos or piercings in shops that are not clean. Where  to find more information Centers for Disease Control and Prevention: [employer]Ex[employer].br World Health Organization: https://castaneda-walker.com/ Contact a doctor if: You have a fever or chills. You have belly pain. Your pee is dark. Your poop is a light color or tan. You have joint pain. Get help right away if: You feel more tired. You do not feel like eating. You cannot eat or drink without vomiting. Your skin or the whites of your eyes turn yellow or turn more  yellow than they were before. You bruise or bleed easily. Summary Hepatitis C is a liver infection that is caused by a virus. Over time, this can lead to serious liver problems. The virus is contagious. This means that it can spread from person to person through blood, semen, or vaginal fluids. Do not take any new medicines unless your doctor says that this is okay. This information is not intended to replace advice given to you by your health care provider. Make sure you discuss any questions you have with your health care provider. Document Revised: 08/09/2020 Document Reviewed: 08/09/2020 Elsevier Patient Education  2023 [employer].

## 2022-03-06 NOTE — Progress Notes (Signed)
Devon [name] 36 y.o.   Chief Complaint  Patient presents with   New Patient (Initial Visit)    Pt Hep C positive wants to be treated     HISTORY OF PRESENT ILLNESS: This is a 36 y.o. male first visit to this office, here to establish care. Past medical history includes polysubstance abuse. 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  Presently on methadone. Vaping occasionally. Diagnosed with hepatitis C about 7 or 8 years ago.  Inquiring about treatment. No other complaints or medical concerns.  HPI   Prior to Admission medications   Medication Sig Start Date End Date Taking? Authorizing Provider  amitriptyline (ELAVIL) 10 MG tablet 1 tablet at bedtime Orally Once a day for 30 day(s)   Yes [provider]  methadone (DOLOPHINE) 10 MG/ML solution 1 mL mixed with water or fruit juice as needed Orally Once a day   Yes [provider]    No Known Allergies  Patient Active Problem List   Diagnosis Date Noted   Adjustment disorder with mixed anxiety and depressed mood 09/06/2018   Cannabis abuse 07/21/2016   Cocaine dependence without complication (HCC) 07/21/2016   Stimulant abuse (HCC) 07/21/2016   Uncomplicated alcohol dependence (HCC) 07/21/2016   Uncomplicated opioid dependence (HCC) 07/21/2016   TOBACCO ABUSE 08/22/2008   ASTHMA 08/22/2008    Past Medical History:  Diagnosis Date   Asthma    Depression 09/06/2018   Poly-drug misuser    heroin, marj, benzo, etoh,     Past Surgical History:  Procedure Laterality Date   ORIF CLAVICLE FRACTURE  2011    Social History   Socioeconomic History   Marital status: Single    Spouse name: Not on file   Number of children: Not on file   Years of education: Not on file   Highest education level: Not on file  Occupational History   Not on file  Tobacco Use   Smoking status: Every Day    Packs/day: 1.00    Types: Cigarettes   Smokeless tobacco: Never  Substance and Sexual Activity   Alcohol  use: Yes    Comment: daily use   Drug use: Yes    Frequency: 3.0 times per week    Types: Marijuana, Heroin, IV    Comment: heroin, benzos, alcohol use   Sexual activity: Not Currently  Other Topics Concern   Not on file  Social History Narrative   Not on file   Social Determinants of Health   Financial Resource Strain: Not on file  Food Insecurity: Not on file  Transportation Needs: Not on file  Physical Activity: Not on file  Stress: Not on file  Social Connections: Not on file  Intimate Partner Violence: Not on file    History reviewed. No pertinent family history.   Review of Systems  Constitutional: Negative.  Negative for chills and fever.  HENT: Negative.  Negative for congestion and sore throat.   Respiratory: Negative.  Negative for cough and shortness of breath.   Cardiovascular: Negative.  Negative for chest pain and palpitations.  Gastrointestinal:  Negative for abdominal pain, diarrhea, nausea and vomiting.  Genitourinary: Negative.   Skin: Negative.  Negative for rash.  Neurological:  Negative for dizziness and headaches.  All other systems reviewed and are negative.  Today's Vitals   03/06/22 1344  BP: 110/76  Pulse: 77  Temp: 98.8 F (37.1 C)  TempSrc: Oral  SpO2: 95%  Weight: 217 lb 2 oz (98.5 kg)  Height: 5\' 11"  (1.803 m)   Body mass index is 30.28 kg/m.  Physical Exam Vitals reviewed.  Constitutional:      Appearance: Normal appearance.  HENT:     Head: Normocephalic.     Right Ear: Tympanic membrane, ear canal and external ear normal.     Left Ear: Tympanic membrane, ear canal and external ear normal.     Mouth/Throat:     Mouth: Mucous membranes are moist.     Pharynx: Oropharynx is clear.  Eyes:     Extraocular Movements: Extraocular movements intact.     Conjunctiva/sclera: Conjunctivae normal.     Pupils: Pupils are equal, round, and reactive to light.  Cardiovascular:     Rate and Rhythm: Normal rate and regular rhythm.      Pulses: Normal pulses.     Heart sounds: Normal heart sounds.  Pulmonary:     Effort: Pulmonary effort is normal.     Breath sounds: Normal breath sounds.  Abdominal:     General: There is no distension.     Palpations: Abdomen is soft.     Tenderness: There is no abdominal tenderness.  Musculoskeletal:        General: Normal range of motion.     Cervical back: No tenderness.     Right lower leg: No edema.     Left lower leg: No edema.  Lymphadenopathy:     Cervical: No cervical adenopathy.  Skin:    General: Skin is warm and dry.     Capillary Refill: Capillary refill takes less than 2 seconds.  Neurological:     General: No focal deficit present.     Mental Status: He is alert and oriented to person, place, and time.  Psychiatric:        Mood and Affect: Mood normal.        Behavior: Behavior normal.     ASSESSMENT & PLAN: A total of 47 minutes was spent with the patient and counseling/coordination of care regarding preparing for this visit, review of available medical records, review of all medications, review of chronic medical problems under management, comprehensive history and physical examination, need for blood work, need for referral to infectious diseases, hepatitis C diagnosis and management, prognosis, documentation, and need for follow-up.  Problem List Items Addressed This Visit       Digestive   Chronic hepatitis C without hepatic coma (HCC) - Primary    Diagnosed 7 to 8 years ago.  Never treated.  Mostly asymptomatic. Needs evaluation by infectious diseases. Referral placed today.       Relevant Orders   CBC with Differential/Platelet   Comprehensive metabolic panel   Hepatitis C antibody   Hemoglobin A1c   Lipid panel   Ambulatory referral to Infectious Disease     Other   Uncomplicated opioid dependence (HCC)    Stable.  Presently on methadone.  Goes to clinic on a regular basis.       Other Visit Diagnoses     Need for vaccination        Relevant Orders   Tdap vaccine greater than or equal to 7yo IM   Encounter to establish care          Patient Instructions  Hepatitis C  Hepatitis C is a liver infection that is caused by a virus. Many people have no symptoms or only mild symptoms. Hepatitis C is contagious. This means that it can spread from person to person. Over time, the infection can  lead to serious liver problems. What are the causes? This condition is caused by a virus. It can spread through: Contact with body fluids from someone who has the virus. This includes: Blood. Semen or vaginal fluids. Childbirth. A woman can pass the virus to her baby during birth. Blood or organ donations that were done in the [place] before 1992. What increases the risk? Having contact with needles or syringes that have the virus on them. This may happen when you: Get acupuncture. This is a treatment that puts thin needles through your skin. Get a tattoo or body piercing. Inject drugs. Having sex with someone who has the virus. The virus can be passed through vaginal, oral, or anal sex. Getting treatment to clean your blood (kidney dialysis). Having HIV or AIDS. Having a job that puts you in contact with blood or body fluids. What are the signs or symptoms? You feel very tired. You are not hungry. You feel like you may vomit or you vomit. You have pain in your belly or fluid builds up in your belly. Your pee is dark yellow. Your skin or the white parts of your eyes look yellow (jaundice). Your skin is itchy. Your poop is light in color. You have joint pain. You bleed or bruise often. Often, there are no symptoms. How is this treated? Treatment may depend on your condition and whether you have liver damage. Treatment may include: Taking antiviral medicines and other medicines. Having follow-up treatments every 6-12 months for liver problems. Getting a new liver from a donor. This is called a liver transplant. Follow  these instructions at home: Medicines Take over-the-counter and prescription medicines only as told by your doctor. If you were given an antiviral medicine, take it as told by your doctor. Do not stop using the antiviral even if you start to feel better. Do not take any new medicines unless your doctor says that this is okay. This includes over-the-counter medicines and supplements. Activity Rest as needed. Do not have sex until your doctor says this is okay. Do not swim or use hot tubs if you have open sores or wounds. Return to your normal activities as told by your doctor. Ask your doctor what activities are safe for you. Ask your doctor when you may go back to school or work. Eating and drinking  Eat a balanced diet. Eat plenty of: Fruits and vegetables. Whole grains. Lean meats or non-meat proteins, such as beans or tofu. Drink enough fluid to keep your pee pale yellow. Do not drink alcohol. General instructions Do not share toothbrushes, nail clippers, or razors. Wash your hands often with soap and water for at least 20 seconds. If you do not have soap and water, use hand sanitizer. Cover any cuts or open sores on your skin. Keep all follow-up visits. You may need follow-up visits every 6-12 months. How is this prevented? Wash your hands often with soap and water for at least 20 seconds. Do not share needles or syringes. Use a condom every time you have vaginal, oral, or anal sex. Latex condoms should be used, if possible. Do not handle blood or body fluids without gloves or other protection. Avoid getting tattoos or piercings in shops that are not clean. Where to find more information Centers for Disease Control and Prevention: FootballExhibition.com.br World Health Organization: https://castaneda-walker.com/ Contact a doctor if: You have a fever or chills. You have belly pain. Your pee is dark. Your poop is a light color or tan. You have  joint pain. Get help right away if: You feel more tired. You  do not feel like eating. You cannot eat or drink without vomiting. Your skin or the whites of your eyes turn yellow or turn more yellow than they were before. You bruise or bleed easily. Summary Hepatitis C is a liver infection that is caused by a virus. Over time, this can lead to serious liver problems. The virus is contagious. This means that it can spread from person to person through blood, semen, or vaginal fluids. Do not take any new medicines unless your doctor says that this is okay. This information is not intended to replace advice given to you by your health care provider. Make sure you discuss any questions you have with your health care provider. Document Revised: 08/09/2020 Document Reviewed: 08/09/2020 Elsevier Patient Education  2023 Elsevier Inc.    Edwina BarthMiguel Jordan Caraveo, MD [facility]

## 2022-03-06 NOTE — Assessment & Plan Note (Signed)
Stable.  Presently on methadone.  Goes to clinic on a regular basis.

## 2022-03-07 LAB — HEMOGLOBIN A1C
Est. average glucose Bld gHb Est-mCnc: 114 mg/dL
Hgb A1c MFr Bld: 5.6 % (ref 4.8–5.6)

## 2022-03-10 LAB — HEPATITIS C ANTIBODY
Hepatitis C Ab: REACTIVE — AB
SIGNAL TO CUT-OFF: >11 — ABNORMAL HIGH (ref <1.00)

## 2022-03-10 LAB — HCV RNA,QUANTITATIVE REAL TIME PCR
HCV Quantitative Log: 5.55 Log IU/mL — ABNORMAL HIGH
HCV RNA, PCR, QN: 351000 IU/mL — ABNORMAL HIGH

## 2022-03-10 NOTE — Progress Notes (Signed)
Good enough.  Thank you.

## 2022-03-11 ENCOUNTER — Other Ambulatory Visit (HOSPITAL_COMMUNITY): Payer: Self-pay

## 2022-03-11 ENCOUNTER — Telehealth: Payer: Self-pay

## 2022-03-11 NOTE — Telephone Encounter (Signed)
[facility] Patient Advocate Encounter  Insurance verification completed.    The patient is insured through [employer].  Medication will need a PA  We will continue to follow to see if copay assistance is needed.  Ross Bender, CPhT [facility] Phone: [phone] Fax:  [phone]  

## 2022-03-12 ENCOUNTER — Encounter: Payer: Self-pay | Admitting: Infectious Diseases

## 2022-03-12 ENCOUNTER — Other Ambulatory Visit: Payer: Self-pay

## 2022-03-12 ENCOUNTER — Ambulatory Visit (INDEPENDENT_AMBULATORY_CARE_PROVIDER_SITE_OTHER): Payer: 59 | Admitting: Infectious Diseases

## 2022-03-12 VITALS — BP 122/84 | HR 76 | Temp 97.4°F | Wt 217.0 lb

## 2022-03-12 DIAGNOSIS — Z114 Encounter for screening for human immunodeficiency virus [HIV]: Secondary | ICD-10-CM | POA: Diagnosis not present

## 2022-03-12 DIAGNOSIS — F17200 Nicotine dependence, unspecified, uncomplicated: Secondary | ICD-10-CM | POA: Diagnosis not present

## 2022-03-12 DIAGNOSIS — F1910 Other psychoactive substance abuse, uncomplicated: Secondary | ICD-10-CM

## 2022-03-12 DIAGNOSIS — B182 Chronic viral hepatitis C: Secondary | ICD-10-CM

## 2022-03-12 DIAGNOSIS — Z7185 Encounter for immunization safety counseling: Secondary | ICD-10-CM

## 2022-03-12 NOTE — Progress Notes (Signed)
[facility]                                      [address], [place], [place], [address]                                               Phn. [phone]; Fax: [phone]                                                               Date: 03/13/2022 Reason for Visit: Hepatitis C    HPI: Devon [name] is a 36 y.o.old male with h/o exercise induced asthma, Depression and polysubstance use on methadone 115mg  daily  who is here for evaluation and management of HCV.   HCV ab tested positive in 2014. He used IV heroin and IV crystal meth up until 3 months ago and is working to quit drugs. But he occasionally ends up buying 20-30$ of fentanyl and snorts when he gets withdrawal. Last snorted fentanyl 2 days ago. He has tattoos placed before he started using IVDU at the age of 86. Denies h/o blood transfusion and probably had sexual encounter with HCV + females in the past. Denies alcohol. Smokes week and half pack of cigarettes a day. He lives with his girlfriend and is sexually active with her. 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 He has not received treatment to date.  Denies any hospitalizations related to liver disease, jaundice, ascites, GI bleeding, mental status changes, abdominal pain and acholic stool.   ROS: all systems reviewed with pertinent positives and negatives as listed above  Current Outpatient Medications on File Prior to Visit  Medication Sig Dispense Refill   amitriptyline (ELAVIL) 10 MG tablet 1 tablet at bedtime Orally Once a day for 30 day(s)     methadone (DOLOPHINE) 10 MG/ML solution 1 mL mixed with water or fruit juice as needed Orally Once a day     No current facility-administered medications on file prior to visit.   No Known Allergies  Past Medical History:  Diagnosis Date   Asthma    Depression 09/06/2018   Hepatitis    Poly-drug misuser    heroin, marj, benzo, etoh,     Past Surgical History:  Procedure Laterality Date   ORIF CLAVICLE FRACTURE  2011   Social History   Socioeconomic History   Marital status: Single    Spouse name: Not on file   Number of children: Not on file   Years of education: Not on file   Highest education level: Not on file  Occupational History   Not on file  Tobacco Use   Smoking status: Former    Packs/day: 1.00    Types: Cigarettes   Smokeless tobacco: Never  Vaping Use   Vaping Use: Never used  Substance and Sexual Activity   Alcohol use: Not Currently    Comment: daily use   Drug use: Yes    Frequency: 3.0 times per week    Types: Marijuana, Heroin, IV  Comment: heroin, benzos, alcohol use   Sexual activity: Yes  Other Topics Concern   Not on file  Social History Narrative   Not on file   Social Determinants of Health   Financial Resource Strain: Not on file  Food Insecurity: Not on file  Transportation Needs: Not on file  Physical Activity: Not on file  Stress: Not on file  Social Connections: Not on file  Intimate Partner Violence: Not on file   No family history on file.  Vitals BP 122/84   Pulse 76   Temp (!) 97.4 F (36.3 C) (Temporal)   Wt 217 lb (98.4 kg)   BMI 30.27 kg/m    Gen:  no acute distress HEENT: [place]/AT, no scleral icterus, no pale conjunctivae, hearing normal, oral mucosa moist Neck: Supple Cardio: Regular rate and rhythm Resp: Pulmonary effort normal on room air GI: Soft, nontender, nondistended GU: MSK - no pedal edema Skin: No rashes Neuro: Grossly non focal, awake, alert and oriented * 3 Psych: Calm, cooperative   Laboratory      Latest Ref Rng & Units 03/06/2022    2:18 PM 09/06/2018   11:50 AM 07/08/2017   11:23 PM  CBC  WBC 4.0 - 10.5 K/uL 8.2   5.5   6.7    Hemoglobin 13.0 - 17.0 g/dL 61.5   37.9   43.2    Hematocrit 39.0 - 52.0 % 39.4   38.3   39.0    Platelets 150.0 - 400.0 K/uL 277.0   281   242        Latest Ref Rng & Units 03/06/2022    2:18  PM 09/06/2018   11:50 AM 07/08/2017   11:23 PM  CMP  Glucose 70 - 99 mg/dL 65   761   470    BUN 6 - 23 mg/dL 13   17   14     Creatinine 0.40 - 1.50 mg/dL   9.29   5.74    Sodium 135 - 145 mEq/L 139   138   140    Potassium 3.5 - 5.1 mEq/L 3.8   4.1   3.6    Chloride 96 - 112 mEq/L 100   106   104    CO2 19 - 32 mEq/L 32   24   27    Calcium 8.4 - 10.5 mg/dL 9.9   9.0   9.3    Total Protein 6.0 - 8.3 g/dL 7.8   6.9   7.4    Total Bilirubin 0.2 - 1.2 mg/dL 0.5   0.7   0.5    Alkaline Phos 39 - 117 U/L 59   42   55    AST 0 - 37 U/L 35   47   24    ALT 0 - 53 U/L 58   58   30     Problem List Items Addressed This Visit       Digestive   Chronic hepatitis C without hepatic coma (HCC) - Primary   Relevant Orders   Hepatitis A antibody, total   Hepatitis B core antibody, total   Hepatitis B surface antibody,qualitative   Hepatitis B surface antigen   Hepatitis C genotype   HIV Antibody (routine testing w rflx)   Liver Fibrosis, FibroTest-ActiTest   Protime-INR (Completed)   7.34 ABDOMEN COMPLETE W/ELASTOGRAPHY     Other   Polysubstance abuse (HCC)   Smoking   Encounter for screening for HIV   Immunization counseling  Assessment/Plan: # Hepatitis C, Treatment Naive Risk factor - IVDU Labs today US abdomen w elastopgraphy Fu in 3-4 weeks for HCV tx with pharmacy   # Polysubstance use On methadone Counseled Discussed re-infection even if treated in case of relapse of drug use  # Smoking Counseled  # Alcohol Use Has stopped recently  Immunization need Will check for Hep A and B serology   HIV screening Check HIV ag/ab  I have personally spent 65  minutes involved in face-to-face and non-face-to-face activities for this patient on the day of the visit. Professional time spent includes the following activities: Preparing to see the patient (review of tests), Obtaining and/or reviewing separately obtained history (admission/discharge record), Performing a  medically appropriate examination and/or evaluation , Ordering medications/tests/procedures, referring and communicating with other health care professionals, Documenting clinical information in the EMR, Independently interpreting results (not separately reported), Communicating results to the patient/family/caregiver, Counseling and educating the patient/family/caregiver and Care coordination (not separately reported).   Patients questions were addressed and answered.   Electronically signed by:  Odette FractionSabina Rushil Kimbrell, MD Infectious Diseases  Office phone [phone] Fax no. [phone]

## 2022-03-13 DIAGNOSIS — F1910 Other psychoactive substance abuse, uncomplicated: Secondary | ICD-10-CM | POA: Insufficient documentation

## 2022-03-13 DIAGNOSIS — Z114 Encounter for screening for human immunodeficiency virus [HIV]: Secondary | ICD-10-CM | POA: Insufficient documentation

## 2022-03-13 DIAGNOSIS — Z7185 Encounter for immunization safety counseling: Secondary | ICD-10-CM | POA: Insufficient documentation

## 2022-03-13 DIAGNOSIS — F17200 Nicotine dependence, unspecified, uncomplicated: Secondary | ICD-10-CM | POA: Insufficient documentation

## 2022-03-19 ENCOUNTER — Ambulatory Visit (HOSPITAL_COMMUNITY): Payer: 59

## 2022-03-19 LAB — LIVER FIBROSIS, FIBROTEST-ACTITEST
ALT: 54 U/L — ABNORMAL HIGH (ref 9–46)
Alpha-2-Macroglobulin: 169 mg/dL (ref 106–279)
Apolipoprotein A1: 174 mg/dL (ref 94–176)
Bilirubin: 0.5 mg/dL (ref 0.2–1.2)
Fibrosis Score: 0.13
GGT: 89 U/L (ref 3–90)
Haptoglobin: 144 mg/dL (ref 43–212)
Necroinflammat ACT Score: 0.26
Reference ID: 4409469

## 2022-03-19 LAB — HEPATITIS B SURFACE ANTIGEN: Hepatitis B Surface Ag: NONREACTIVE

## 2022-03-19 LAB — HIV ANTIBODY (ROUTINE TESTING W REFLEX): HIV 1&2 Ab, 4th Generation: NONREACTIVE

## 2022-03-19 LAB — HEPATITIS B SURFACE ANTIBODY,QUALITATIVE: Hep B S Ab: NONREACTIVE

## 2022-03-19 LAB — PROTIME-INR
INR: 0.9
Prothrombin Time: 9.7 s (ref 9.0–11.5)

## 2022-03-19 LAB — HEPATITIS C GENOTYPE: HCV Genotype: 3

## 2022-03-19 LAB — HEPATITIS A ANTIBODY, TOTAL: Hepatitis A AB,Total: NONREACTIVE

## 2022-03-19 LAB — HEPATITIS B CORE ANTIBODY, TOTAL: Hep B Core Total Ab: NONREACTIVE

## 2022-03-21 ENCOUNTER — Ambulatory Visit (HOSPITAL_COMMUNITY)
Admission: RE | Admit: 2022-03-21 | Discharge: 2022-03-21 | Disposition: A | Discharge status: Home / Self Care | Payer: 59 | Source: Ambulatory Visit | Attending: Infectious Diseases | Admitting: Infectious Diseases

## 2022-03-21 DIAGNOSIS — B182 Chronic viral hepatitis C: Secondary | ICD-10-CM | POA: Insufficient documentation

## 2022-03-24 ENCOUNTER — Other Ambulatory Visit: Payer: Self-pay | Admitting: Infectious Diseases

## 2022-03-24 ENCOUNTER — Telehealth: Payer: Self-pay

## 2022-03-24 ENCOUNTER — Telehealth: Payer: Self-pay | Admitting: Pharmacist

## 2022-03-24 DIAGNOSIS — B182 Chronic viral hepatitis C: Secondary | ICD-10-CM

## 2022-03-24 NOTE — Telephone Encounter (Signed)
-----   Message from Odette Fraction, MD sent at 03/24/2022 10:52 AM EDT ----- Regarding: HCV lab Team,   Please have him schedule a lab visit for Hep C resistance testing for treatment planning for HCV. It is ordered in EMR. Fu to be made pending test. Thanks

## 2022-03-24 NOTE — Telephone Encounter (Signed)
Entered in error

## 2022-03-24 NOTE — Telephone Encounter (Signed)
Called patient to set up appointment, no answer and no voicemail box set up.   Sandie Ano, RN

## 2022-03-25 ENCOUNTER — Other Ambulatory Visit: Payer: 59

## 2022-03-25 ENCOUNTER — Other Ambulatory Visit: Payer: Self-pay

## 2022-03-25 DIAGNOSIS — B182 Chronic viral hepatitis C: Secondary | ICD-10-CM

## 2022-04-07 ENCOUNTER — Encounter (HOSPITAL_COMMUNITY): Payer: Self-pay | Admitting: Emergency Medicine

## 2022-04-07 ENCOUNTER — Ambulatory Visit (HOSPITAL_COMMUNITY)
Admission: EM | Admit: 2022-04-07 | Discharge: 2022-04-07 | Disposition: A | Discharge status: Home / Self Care | Payer: 59 | Attending: Physician Assistant | Admitting: Physician Assistant

## 2022-04-07 DIAGNOSIS — Z760 Encounter for issue of repeat prescription: Secondary | ICD-10-CM

## 2022-04-07 DIAGNOSIS — R61 Generalized hyperhidrosis: Secondary | ICD-10-CM | POA: Diagnosis not present

## 2022-04-07 MED ORDER — OXYBUTYNIN CHLORIDE 5 MG PO TABS: 5.0000 mg | ORAL_TABLET | Freq: Three times a day (TID) | ORAL | 0 refills | Status: AC

## 2022-04-07 NOTE — ED Triage Notes (Signed)
Pt reports that was seen here few months ago and got prescribed Oxybutynin 5mg  and needing refilled.

## 2022-04-07 NOTE — ED Provider Notes (Signed)
[facility]    CSN: [id number] Arrival date & time: 04/07/22  1439      History   Chief Complaint Chief Complaint  Patient presents with   Medication Refill    HPI Devon [name] is a 36 y.o. male.   Pt presents for medication refill of oxybutinin that was prescribed in March of this year for excessive sweating.  Pt is currently on methadone which is felt to be the culprit vs anxiety.  He the oxybutnin has provided good relief and he has only taken the medication when he has had to go to work or out in public.  He has set up an appointment with a PCP and reports it is in two days.       Past Medical History:  Diagnosis Date   Asthma    Depression 09/06/2018   Hepatitis    Poly-drug misuser    heroin, marj, benzo, etoh,     Patient Active Problem List   Diagnosis Date Noted   Polysubstance abuse ([place]) 03/13/2022   Smoking 03/13/2022   Encounter for screening for HIV 03/13/2022   Immunization counseling 03/13/2022   Chronic hepatitis C without hepatic coma ([place]) 03/06/2022   Adjustment disorder with mixed anxiety and depressed mood 09/06/2018   Cannabis abuse 07/21/2016   Cocaine dependence without complication ([place]) 0000000   Stimulant abuse ([place]) 0000000   Uncomplicated alcohol dependence ([place]) 0000000   Uncomplicated opioid dependence ([place]) 07/21/2016   TOBACCO ABUSE 08/22/2008   ASTHMA 08/22/2008    Past Surgical History:  Procedure Laterality Date   ORIF CLAVICLE FRACTURE  2011       Home Medications    Prior to Admission medications   Medication Sig Start Date End Date Taking? Authorizing Provider  oxybutynin (DITROPAN) 5 MG tablet Take 1 tablet (5 mg total) by mouth 3 (three) times daily. 04/07/22 05/07/22 Yes Ward, Lenise Arena, PA-C  amitriptyline (ELAVIL) 10 MG tablet 1 tablet at bedtime Orally Once a day for 30 day(s)    [provider]  methadone (DOLOPHINE) 10 MG/ML solution 1 mL mixed with water or fruit juice as  needed Orally Once a day    [provider]    Family History No family history on file.  Social History Social History   Tobacco Use   Smoking status: Former    Packs/day: 1.00    Types: Cigarettes   Smokeless tobacco: Never  Vaping Use   Vaping Use: Never used  Substance Use Topics   Alcohol use: Not Currently    Comment: daily use   Drug use: Yes    Frequency: 3.0 times per week    Types: Marijuana, Heroin, IV    Comment: heroin, benzos, alcohol use     Allergies   Patient has no known allergies.   Review of Systems Review of Systems  Constitutional:  Positive for diaphoresis. Negative for chills and fever.  HENT:  Negative for ear pain and sore throat.   Eyes:  Negative for pain and visual disturbance.  Respiratory:  Negative for cough and shortness of breath.   Cardiovascular:  Negative for chest pain and palpitations.  Gastrointestinal:  Negative for abdominal pain and vomiting.  Genitourinary:  Negative for dysuria and hematuria.  Musculoskeletal:  Negative for arthralgias and back pain.  Skin:  Negative for color change and rash.  Neurological:  Negative for seizures and syncope.  All other systems reviewed and are negative.    Physical Exam Triage Vital Signs  ED Triage Vitals  Enc Vitals Group     BP 04/07/22 1637 118/65     Pulse Rate 04/07/22 1637 62     Resp 04/07/22 1637 18     Temp 04/07/22 1637 97.6 F (36.4 C)     Temp Source 04/07/22 1637 Oral     SpO2 04/07/22 1637 97 %     Weight --      Height --      Head Circumference --      Peak Flow --      Pain Score 04/07/22 1636 0     Pain Loc --      Pain Edu? --      Excl. in GC? --    No data found.  Updated Vital Signs BP 118/65 (BP Location: Right Arm)   Pulse 62   Temp 97.6 F (36.4 C) (Oral)   Resp 18   SpO2 97%   Visual Acuity Right Eye Distance:   Left Eye Distance:   Bilateral Distance:    Right Eye Near:   Left Eye Near:    Bilateral Near:     Physical  Exam Vitals and nursing note reviewed.  Constitutional:      General: He is not in acute distress.    Appearance: He is well-developed.  HENT:     Head: Normocephalic and atraumatic.  Eyes:     Conjunctiva/sclera: Conjunctivae normal.  Cardiovascular:     Rate and Rhythm: Normal rate and regular rhythm.     Heart sounds: No murmur heard. Pulmonary:     Effort: Pulmonary effort is normal. No respiratory distress.     Breath sounds: Normal breath sounds.  Abdominal:     Palpations: Abdomen is soft.     Tenderness: There is no abdominal tenderness.  Musculoskeletal:        General: No swelling.     Cervical back: Neck supple.  Skin:    General: Skin is warm and dry.     Capillary Refill: Capillary refill takes less than 2 seconds.  Neurological:     Mental Status: He is alert.  Psychiatric:        Mood and Affect: Mood normal.      UC Treatments / Results  Labs (all labs ordered are listed, but only abnormal results are displayed) Labs Reviewed - No data to display  EKG   Radiology No results found.  Procedures Procedures (including critical care time)  Medications Ordered in UC Medications - No data to display  Initial Impression / Assessment and Plan / UC Course  I have reviewed the triage vital signs and the nursing notes.  Pertinent labs & imaging results that were available during my care of the patient were reviewed by me and considered in my medical decision making (see chart for details).     Will provide refill today, advised future refills need to be managed by PCP and discussed the importance of keeping his upcoming appointment.  Final Clinical Impressions(s) / UC Diagnoses   Final diagnoses:  Medication refill     Discharge Instructions      Take medication as needed  Keep upcoming appointment with primary care physician for future refills.    ED Prescriptions     Medication Sig Dispense Auth. Provider   oxybutynin (DITROPAN) 5 MG  tablet Take 1 tablet (5 mg total) by mouth 3 (three) times daily. 90 tablet Ward, Tylene Fantasia, PA-C      PDMP not reviewed this  encounter.   Ward, Tylene Fantasia, PA-C 04/07/22 [phone]

## 2022-04-07 NOTE — Discharge Instructions (Addendum)
Take medication as needed  Keep upcoming appointment with primary care physician for future refills.

## 2022-04-09 ENCOUNTER — Encounter: Payer: Self-pay | Admitting: Emergency Medicine

## 2022-04-09 ENCOUNTER — Ambulatory Visit (INDEPENDENT_AMBULATORY_CARE_PROVIDER_SITE_OTHER): Payer: 59 | Admitting: Emergency Medicine

## 2022-04-09 VITALS — BP 106/64 | HR 68 | Temp 97.7°F | Ht 71.0 in | Wt 220.1 lb

## 2022-04-09 DIAGNOSIS — Z0000 Encounter for general adult medical examination without abnormal findings: Secondary | ICD-10-CM

## 2022-04-09 DIAGNOSIS — F4310 Post-traumatic stress disorder, unspecified: Secondary | ICD-10-CM | POA: Diagnosis not present

## 2022-04-09 DIAGNOSIS — F1120 Opioid dependence, uncomplicated: Secondary | ICD-10-CM

## 2022-04-09 DIAGNOSIS — F411 Generalized anxiety disorder: Secondary | ICD-10-CM | POA: Diagnosis not present

## 2022-04-09 DIAGNOSIS — B182 Chronic viral hepatitis C: Secondary | ICD-10-CM

## 2022-04-09 NOTE — Patient Instructions (Signed)
Health Maintenance, Male Adopting a healthy lifestyle and getting preventive care are important in promoting health and wellness. Ask your health care provider about: The right schedule for you to have regular tests and exams. Things you can do on your own to prevent diseases and keep yourself healthy. What should I know about diet, weight, and exercise? Eat a healthy diet  Eat a diet that includes plenty of vegetables, fruits, low-fat dairy products, and lean protein. Do not eat a lot of foods that are high in solid fats, added sugars, or sodium. Maintain a healthy weight Body mass index (BMI) is a measurement that can be used to identify possible weight problems. It estimates body fat based on height and weight. Your health care provider can help determine your BMI and help you achieve or maintain a healthy weight. Get regular exercise Get regular exercise. This is one of the most important things you can do for your health. Most adults should: Exercise for at least 150 minutes each week. The exercise should increase your heart rate and make you sweat (moderate-intensity exercise). Do strengthening exercises at least twice a week. This is in addition to the moderate-intensity exercise. Spend less time sitting. Even light physical activity can be beneficial. Watch cholesterol and blood lipids Have your blood tested for lipids and cholesterol at 35 years of age, then have this test every 5 years. You may need to have your cholesterol levels checked more often if: Your lipid or cholesterol levels are high. You are older than 35 years of age. You are at high risk for heart disease. What should I know about cancer screening? Many types of cancers can be detected early and may often be prevented. Depending on your health history and family history, you may need to have cancer screening at various ages. This may include screening for: Colorectal cancer. Prostate cancer. Skin cancer. Lung  cancer. What should I know about heart disease, diabetes, and high blood pressure? Blood pressure and heart disease High blood pressure causes heart disease and increases the risk of stroke. This is more likely to develop in people who have high blood pressure readings or are overweight. Talk with your health care provider about your target blood pressure readings. Have your blood pressure checked: Every 3-5 years if you are 18-39 years of age. Every year if you are 40 years old or older. If you are between the ages of 65 and 75 and are a current or former smoker, ask your health care provider if you should have a one-time screening for abdominal aortic aneurysm (AAA). Diabetes Have regular diabetes screenings. This checks your fasting blood sugar level. Have the screening done: Once every three years after age 45 if you are at a normal weight and have a low risk for diabetes. More often and at a younger age if you are overweight or have a high risk for diabetes. What should I know about preventing infection? Hepatitis B If you have a higher risk for hepatitis B, you should be screened for this virus. Talk with your health care provider to find out if you are at risk for hepatitis B infection. Hepatitis C Blood testing is recommended for: Everyone born from 1945 through 1965. Anyone with known risk factors for hepatitis C. Sexually transmitted infections (STIs) You should be screened each year for STIs, including gonorrhea and chlamydia, if: You are sexually active and are younger than 35 years of age. You are older than 35 years of age and your   health care provider tells you that you are at risk for this type of infection. Your sexual activity has changed since you were last screened, and you are at increased risk for chlamydia or gonorrhea. Ask your health care provider if you are at risk. Ask your health care provider about whether you are at high risk for HIV. Your health care provider  may recommend a prescription medicine to help prevent HIV infection. If you choose to take medicine to prevent HIV, you should first get tested for HIV. You should then be tested every 3 months for as long as you are taking the medicine. Follow these instructions at home: Alcohol use Do not drink alcohol if your health care provider tells you not to drink. If you drink alcohol: Limit how much you have to 0-2 drinks a day. Know how much alcohol is in your drink. In the [place] one drink equals one 12 oz bottle of beer (355 mL), one 5 oz glass of wine (148 mL), or one 1 oz glass of hard liquor (44 mL). Lifestyle Do not use any products that contain nicotine or tobacco. These products include cigarettes, chewing tobacco, and vaping devices, such as e-cigarettes. If you need help quitting, ask your health care provider. Do not use street drugs. Do not share needles. Ask your health care provider for help if you need support or information about quitting drugs. General instructions Schedule regular health, dental, and eye exams. Stay current with your vaccines. Tell your health care provider if: You often feel depressed. You have ever been abused or do not feel safe at home. Summary Adopting a healthy lifestyle and getting preventive care are important in promoting health and wellness. Follow your health care provider's instructions about healthy diet, exercising, and getting tested or screened for diseases. Follow your health care provider's instructions on monitoring your cholesterol and blood pressure. This information is not intended to replace advice given to you by your health care provider. Make sure you discuss any questions you have with your health care provider. Document Revised: 02/11/2021 Document Reviewed: 02/11/2021 Elsevier Patient Education  2023 Elsevier Inc.  

## 2022-04-09 NOTE — Progress Notes (Signed)
Devon [name] 36 y.o.   Chief Complaint  Patient presents with   Annual Exam    No concerns     HISTORY OF PRESENT ILLNESS: This is a 36 y.o. male here for annual physical exam. Has history of chronic hepatitis C.  Recently seen by infectious diseases doctor. History of polysubstance abuse. No longer drinking. Not smoking at present time. History of PTSD. No other complaints or medical concerns today.  HPI   Prior to Admission medications   Medication Sig Start Date End Date Taking? Authorizing Provider  methadone (DOLOPHINE) 10 MG/ML solution 1 mL mixed with water or fruit juice as needed Orally Once a day   Yes [provider]  oxybutynin (DITROPAN) 5 MG tablet Take 1 tablet (5 mg total) by mouth 3 (three) times daily. 04/07/22 05/07/22 Yes Ward, Lenise Arena, PA-C  amitriptyline (ELAVIL) 10 MG tablet 1 tablet at bedtime Orally Once a day for 30 day(s)    [provider]    No Known Allergies  Patient Active Problem List   Diagnosis Date Noted   Polysubstance abuse ([place]) 03/13/2022   Smoking 03/13/2022   Encounter for screening for HIV 03/13/2022   Immunization counseling 03/13/2022   Chronic hepatitis C without hepatic coma ([place]) 03/06/2022   Adjustment disorder with mixed anxiety and depressed mood 09/06/2018   Cannabis abuse 07/21/2016   Cocaine dependence without complication ([place]) 0000000   Stimulant abuse ([place]) 0000000   Uncomplicated alcohol dependence ([place]) 0000000   Uncomplicated opioid dependence ([place]) 07/21/2016   TOBACCO ABUSE 08/22/2008   ASTHMA 08/22/2008    Past Medical History:  Diagnosis Date   Asthma    Depression 09/06/2018   Hepatitis    Poly-drug misuser    heroin, marj, benzo, etoh,     Past Surgical History:  Procedure Laterality Date   ORIF CLAVICLE FRACTURE  2011    Social History   Socioeconomic History   Marital status: Single    Spouse name: Not on file   Number of children: Not on file   Years  of education: Not on file   Highest education level: Not on file  Occupational History   Not on file  Tobacco Use   Smoking status: Former    Packs/day: 1.00    Types: Cigarettes   Smokeless tobacco: Never  Vaping Use   Vaping Use: Never used  Substance and Sexual Activity   Alcohol use: Not Currently    Comment: daily use   Drug use: Yes    Frequency: 3.0 times per week    Types: Marijuana, Heroin, IV    Comment: heroin, benzos, alcohol use   Sexual activity: Yes  Other Topics Concern   Not on file  Social History Narrative   Not on file   Social Determinants of Health   Financial Resource Strain: Not on file  Food Insecurity: Not on file  Transportation Needs: Not on file  Physical Activity: Not on file  Stress: Not on file  Social Connections: Not on file  Intimate Partner Violence: Not on file    No family history on file.   Review of Systems  Constitutional: Negative.  Negative for chills and fever.  HENT: Negative.  Negative for congestion and sore throat.   Respiratory: Negative.  Negative for cough and shortness of breath.   Cardiovascular: Negative.  Negative for chest pain and palpitations.  Gastrointestinal: Negative.  Negative for abdominal pain, diarrhea, nausea and vomiting.  Genitourinary: Negative.  Negative for dysuria  and hematuria.  Musculoskeletal: Negative.   Skin: Negative.  Negative for rash.  Neurological: Negative.  Negative for dizziness and headaches.  Psychiatric/Behavioral:  Positive for depression and substance abuse. The patient is nervous/anxious.   All other systems reviewed and are negative.  Today's Vitals   04/09/22 1453  BP: 106/64  Pulse: 68  Temp: 97.7 F (36.5 C)  TempSrc: Oral  SpO2: 92%  Weight: 220 lb 2 oz (99.8 kg)  Height: 5\' 11"  (1.803 m)   Body mass index is 30.7 kg/m.   Physical Exam Vitals reviewed.  Constitutional:      Appearance: Normal appearance.  HENT:     Head: Normocephalic.     Right Ear:  Tympanic membrane, ear canal and external ear normal.     Left Ear: Tympanic membrane, ear canal and external ear normal.     Mouth/Throat:     Mouth: Mucous membranes are moist.     Pharynx: Oropharynx is clear.  Eyes:     Extraocular Movements: Extraocular movements intact.     Pupils: Pupils are equal, round, and reactive to light.  Cardiovascular:     Rate and Rhythm: Normal rate and regular rhythm.     Pulses: Normal pulses.     Heart sounds: Normal heart sounds.  Pulmonary:     Effort: Pulmonary effort is normal.     Breath sounds: Normal breath sounds.  Abdominal:     General: There is no distension.     Palpations: Abdomen is soft.     Tenderness: There is no abdominal tenderness.  Musculoskeletal:     Cervical back: Neck supple.     Right lower leg: No edema.     Left lower leg: No edema.  Lymphadenopathy:     Cervical: No cervical adenopathy.  Skin:    General: Skin is warm and dry.     Capillary Refill: Capillary refill takes less than 2 seconds.  Neurological:     General: No focal deficit present.     Mental Status: He is alert and oriented to person, place, and time.  Psychiatric:        Mood and Affect: Mood normal.        Behavior: Behavior normal.      ASSESSMENT & PLAN: Problem List Items Addressed This Visit       Digestive   Chronic hepatitis C without hepatic coma (HCC)     Other   Uncomplicated opioid dependence (HCC)   Other Visit Diagnoses     Routine general medical examination at a health care facility    -  Primary   PTSD (post-traumatic stress disorder)       Relevant Orders   Ambulatory referral to Psychiatry   Generalized anxiety disorder       Relevant Orders   Ambulatory referral to Psychiatry      Modifiable risk factors discussed with patient. Anticipatory guidance according to age provided. The following topics were also discussed: Social Determinants of Health Smoking.  Non-smoker at present time.   Cardiovascular/cancer risk associated with smoking discussed Diet and nutrition and need to decrease amount of daily carbohydrate intake and daily calories Benefits of exercise Cancer family history review and liver cancer associated with chronic hepatitis C infection Vaccinations recommendations Cardiovascular risk assessment Mental health including depression and anxiety and diagnosis of PTSD and need to follow-up with behavioral health for further evaluation and treatment. Fall and accident prevention  Patient Instructions  Health Maintenance, Male Adopting a healthy lifestyle  and getting preventive care are important in promoting health and wellness. Ask your health care provider about: The right schedule for you to have regular tests and exams. Things you can do on your own to prevent diseases and keep yourself healthy. What should I know about diet, weight, and exercise? Eat a healthy diet  Eat a diet that includes plenty of vegetables, fruits, low-fat dairy products, and lean protein. Do not eat a lot of foods that are high in solid fats, added sugars, or sodium. Maintain a healthy weight Body mass index (BMI) is a measurement that can be used to identify possible weight problems. It estimates body fat based on height and weight. Your health care provider can help determine your BMI and help you achieve or maintain a healthy weight. Get regular exercise Get regular exercise. This is one of the most important things you can do for your health. Most adults should: Exercise for at least 150 minutes each week. The exercise should increase your heart rate and make you sweat (moderate-intensity exercise). Do strengthening exercises at least twice a week. This is in addition to the moderate-intensity exercise. Spend less time sitting. Even light physical activity can be beneficial. Watch cholesterol and blood lipids Have your blood tested for lipids and cholesterol at 35 years of age, then  have this test every 5 years. You may need to have your cholesterol levels checked more often if: Your lipid or cholesterol levels are high. You are older than 35 years of age. You are at high risk for heart disease. What should I know about cancer screening? Many types of cancers can be detected early and may often be prevented. Depending on your health history and family history, you may need to have cancer screening at various ages. This may include screening for: Colorectal cancer. Prostate cancer. Skin cancer. Lung cancer. What should I know about heart disease, diabetes, and high blood pressure? Blood pressure and heart disease High blood pressure causes heart disease and increases the risk of stroke. This is more likely to develop in people who have high blood pressure readings or are overweight. Talk with your health care provider about your target blood pressure readings. Have your blood pressure checked: Every 3-5 years if you are 15-12 years of age. Every year if you are 104 years old or older. If you are between the ages of 35 and 34 and are a current or former smoker, ask your health care provider if you should have a one-time screening for abdominal aortic aneurysm (AAA). Diabetes Have regular diabetes screenings. This checks your fasting blood sugar level. Have the screening done: Once every three years after age 74 if you are at a normal weight and have a low risk for diabetes. More often and at a younger age if you are overweight or have a high risk for diabetes. What should I know about preventing infection? Hepatitis B If you have a higher risk for hepatitis B, you should be screened for this virus. Talk with your health care provider to find out if you are at risk for hepatitis B infection. Hepatitis C Blood testing is recommended for: Everyone born from [birth year] through 1965. Anyone with known risk factors for hepatitis C. Sexually transmitted infections (STIs) You  should be screened each year for STIs, including gonorrhea and chlamydia, if: You are sexually active and are younger than 35 years of age. You are older than 35 years of age and your health care provider tells you that  you are at risk for this type of infection. Your sexual activity has changed since you were last screened, and you are at increased risk for chlamydia or gonorrhea. Ask your health care provider if you are at risk. Ask your health care provider about whether you are at high risk for HIV. Your health care provider may recommend a prescription medicine to help prevent HIV infection. If you choose to take medicine to prevent HIV, you should first get tested for HIV. You should then be tested every 3 months for as long as you are taking the medicine. Follow these instructions at home: Alcohol use Do not drink alcohol if your health care provider tells you not to drink. If you drink alcohol: Limit how much you have to 0-2 drinks a day. Know how much alcohol is in your drink. In the [place] one drink equals one 12 oz bottle of beer (355 mL), one 5 oz glass of wine (148 mL), or one 1 oz glass of hard liquor (44 mL). Lifestyle Do not use any products that contain nicotine or tobacco. These products include cigarettes, chewing tobacco, and vaping devices, such as e-cigarettes. If you need help quitting, ask your health care provider. Do not use street drugs. Do not share needles. Ask your health care provider for help if you need support or information about quitting drugs. General instructions Schedule regular health, dental, and eye exams. Stay current with your vaccines. Tell your health care provider if: You often feel depressed. You have ever been abused or do not feel safe at home. Summary Adopting a healthy lifestyle and getting preventive care are important in promoting health and wellness. Follow your health care provider's instructions about healthy diet, exercising, and  getting tested or screened for diseases. Follow your health care provider's instructions on monitoring your cholesterol and blood pressure. This information is not intended to replace advice given to you by your health care provider. Make sure you discuss any questions you have with your health care provider. Document Revised: 02/11/2021 Document Reviewed: 02/11/2021 Elsevier Patient Education  [place], MD [facility]

## 2022-04-12 LAB — HCV VIRAL RNA GEN3 NS5A DRUG RESIST

## 2022-04-14 ENCOUNTER — Other Ambulatory Visit (HOSPITAL_COMMUNITY): Payer: Self-pay

## 2022-04-14 ENCOUNTER — Telehealth: Payer: Self-pay

## 2022-04-14 ENCOUNTER — Other Ambulatory Visit: Payer: Self-pay | Admitting: Pharmacist

## 2022-04-14 DIAGNOSIS — B182 Chronic viral hepatitis C: Secondary | ICD-10-CM

## 2022-04-14 MED ORDER — SOFOSBUVIR-VELPATASVIR 400-100 MG PO TABS
1.0000 | ORAL_TABLET | Freq: Every day | ORAL | 2 refills | Status: DC
  Filled 2022-04-14: qty 28, 28d supply, fill #0

## 2022-04-14 NOTE — Progress Notes (Signed)
Sounds good. We will start the PA process. Thanks!

## 2022-04-14 NOTE — Telephone Encounter (Signed)
[facility] Patient Advocate Encounter   Received notification from [employer] that prior authorization for Henri Medal is required.   PA submitted on 04/14/22 Key [id number] Status is pending    [facility] will continue to follow.   Clearance Coots, CPhT Specialty Pharmacy Patient [facility] for Infectious Disease Phone: [phone] Fax:  [phone]

## 2022-04-16 ENCOUNTER — Other Ambulatory Visit (HOSPITAL_COMMUNITY): Payer: Self-pay

## 2022-04-17 ENCOUNTER — Other Ambulatory Visit: Payer: Self-pay | Admitting: Pharmacist

## 2022-04-17 ENCOUNTER — Telehealth: Payer: Self-pay

## 2022-04-17 DIAGNOSIS — B182 Chronic viral hepatitis C: Secondary | ICD-10-CM

## 2022-04-17 MED ORDER — SOFOSBUVIR-VELPATASVIR 400-100 MG PO TABS: 1.0000 | ORAL_TABLET | Freq: Every day | ORAL | 2 refills | Status: AC

## 2022-04-17 NOTE — Telephone Encounter (Addendum)
[facility] Patient Advocate Encounter  Prior Authorization for [employer] (generic) has been approved.    PA# [id number] Effective dates: 04/14/22 through 07/15/22  Prescription can be filled with [facility] will continue to follow.  Clearance Coots, CPhT Specialty Pharmacy Patient [facility] for Infectious Disease Phone: [phone] Fax:  [phone]

## 2022-05-01 ENCOUNTER — Telehealth: Payer: Self-pay

## 2022-05-01 ENCOUNTER — Encounter: Payer: Self-pay | Admitting: Pharmacist

## 2022-05-01 NOTE — Telephone Encounter (Signed)
[facility] Patient Advocate Encounter  I have been unsuccsessful in reaching patient to be able to set a 1 month follow up appointment.  I spoke with [facility] and was told patient received Epclusa on 07/23.  We have tried multiple times without a response.  Devon [name], CPhT Specialty Pharmacy Patient [facility] for Infectious Disease Phone: [phone] Fax:  [phone]

## 2022-05-01 NOTE — Telephone Encounter (Signed)
Sent patient a MyChart message to try and set up an appointment. Marchelle Folks

## 2022-05-07 ENCOUNTER — Telehealth: Payer: Self-pay

## 2022-05-07 NOTE — Telephone Encounter (Signed)
Patient called, he received his first 28 days of Epclusa, but does not want to start it yet since he will be losing his [employer] insurance at the end of the month.   He will be getting [place] insurance at the beginning of September and wanted to make sure this would not affect his refills of Epclusa prior to starting his treatment. He would prefer to avoid any interruption in treatment.   Sandie Ano, RN

## 2022-05-08 NOTE — Telephone Encounter (Signed)
Ok I will call him and let him know ... thanks

## 2022-05-08 NOTE — Telephone Encounter (Signed)
I spoke with [name] he will wait a month to start Epclusa he will get his 2nd bottle from [facility] and when he get his insurance card in the mail he will call me with the information and I will then start a new PA for his 3rd bottle of Epclusa.

## 2022-05-08 NOTE — Telephone Encounter (Signed)
Yea I think he should wait. It's no rush and I'd rather him wait another month to start then start and have to stop because his insurance may give [place] trouble.

## 2022-05-08 NOTE — Telephone Encounter (Signed)
I will reach out to him ,because I will need to do another PA when he gets new insurance. Marchelle Folks or Cassie do you think he should wait?

## 2022-05-08 NOTE — Telephone Encounter (Signed)
Thanks Donna!

## 2022-05-19 ENCOUNTER — Telehealth: Payer: Self-pay

## 2022-05-19 NOTE — Telephone Encounter (Signed)
Patient called office to follow up on insurance concerns and getting hep c treatment. States that since he [employer] insurance is no longer covering patient's he is afraid he wont  be able to continue getting Epculsa.  Will forward message to pharmacy team for assistance. Juanita Laster, RMA

## 2022-06-10 ENCOUNTER — Telehealth: Payer: Self-pay

## 2022-06-10 ENCOUNTER — Other Ambulatory Visit (HOSPITAL_COMMUNITY): Payer: Self-pay

## 2022-06-10 ENCOUNTER — Telehealth: Payer: Self-pay | Admitting: Pharmacist

## 2022-06-10 NOTE — Telephone Encounter (Signed)
Patient is approved to receive Epclusa x 12 weeks for chronic Hepatitis C infection. Counseled patient to take Epclusa daily with or without food. Encouraged patient not to miss any doses and explained how their chance of cure could go down with each dose missed. Counseled patient on what to do if dose is missed - if it is closer to the missed dose take immediately; if closer to next dose skip dose and take the next dose at the usual time. Counseled patient on common side effects such as headache, fatigue, and nausea and that these normally decrease with time. I reviewed patient medications and found 0 drug interactions. Patient does report taking Tums occasionally and counseled him to separate out epclusa from Tums by at least 4 hours. Discussed with patient that there are several drug interactions including acid suppressants. Instructed patient to call clinic if he wishes to start a new medication during course of therapy. Also advised patient to call if he experiences any side effects. Patient will follow-up with Marchelle Folks on 07/10/2022.   Jani Gravel, PharmD PGY-2 Infectious Diseases Resident  06/10/2022 2:31 PM

## 2022-06-10 NOTE — Telephone Encounter (Signed)
[facility] Patient Advocate Encounter  Prior Authorization for Dorita Fray has been approved.    PA# [id number] Effective dates: 06/10/22 through 07/10/22  I will send approval to [facility] where patient received his last 2 bottles of Epclusa from.  [facility] will continue to follow.  Clearance Coots, CPhT Specialty Pharmacy Patient [facility] for Infectious Disease Phone: [phone] Fax:  [phone]

## 2022-06-27 ENCOUNTER — Ambulatory Visit (HOSPITAL_COMMUNITY)
Admission: EM | Admit: 2022-06-27 | Discharge: 2022-06-27 | Disposition: A | Discharge status: Home / Self Care | Payer: Commercial Managed Care - HMO | Attending: Family Medicine | Admitting: Family Medicine

## 2022-06-27 ENCOUNTER — Encounter (HOSPITAL_COMMUNITY): Payer: Self-pay

## 2022-06-27 DIAGNOSIS — Z760 Encounter for issue of repeat prescription: Secondary | ICD-10-CM

## 2022-06-27 DIAGNOSIS — R61 Generalized hyperhidrosis: Secondary | ICD-10-CM | POA: Diagnosis not present

## 2022-06-27 MED ORDER — OXYBUTYNIN CHLORIDE 5 MG PO TABS: 5.0000 mg | ORAL_TABLET | Freq: Three times a day (TID) | ORAL | 0 refills | Status: DC

## 2022-06-27 NOTE — ED Triage Notes (Signed)
Pt states he needs a refill on his oxybutynin.

## 2022-06-27 NOTE — Discharge Instructions (Signed)
You were seen today for medication refill.  This has been sent to your pharmacy today.  Please follow up with your primary care provider for further refills.

## 2022-06-27 NOTE — ED Provider Notes (Signed)
[place]    CSN: [id number] Arrival date & time: 06/27/22  1346      History   Chief Complaint Chief Complaint  Patient presents with   Medication Refill    HPI Devon [name] is a 36 y.o. male.   Patient is here for refill of oxybutynin for excessive sweating.  This works better than nothing.  His PCP is out of the office and won't be back until next week, and he is needing a refill.   He did have some dry mouth initially, but that is getting better.   Past Medical History:  Diagnosis Date   Asthma    Depression 09/06/2018   Hepatitis    Poly-drug misuser    heroin, marj, benzo, etoh,     Patient Active Problem List   Diagnosis Date Noted   Polysubstance abuse ([place]) 03/13/2022   Smoking 03/13/2022   Encounter for screening for HIV 03/13/2022   Immunization counseling 03/13/2022   Chronic hepatitis C without hepatic coma ([place]) 03/06/2022   Adjustment disorder with mixed anxiety and depressed mood 09/06/2018   Cannabis abuse 07/21/2016   Cocaine dependence without complication ([place]) 96/29/5284   Stimulant abuse ([place]) 13/24/4010   Uncomplicated alcohol dependence ([place]) 27/25/3664   Uncomplicated opioid dependence ([place]) 07/21/2016   TOBACCO ABUSE 08/22/2008   ASTHMA 08/22/2008    Past Surgical History:  Procedure Laterality Date   ORIF CLAVICLE FRACTURE  2011       Home Medications    Prior to Admission medications   Medication Sig Start Date End Date Taking? Authorizing Provider  oxybutynin (DITROPAN) 5 MG tablet Take 5 mg by mouth 3 (three) times daily.   Yes [provider]  amitriptyline (ELAVIL) 10 MG tablet 1 tablet at bedtime Orally Once a day for 30 day(s)    [provider]  methadone (DOLOPHINE) 10 MG/ML solution 1 mL mixed with water or fruit juice as needed Orally Once a day    [provider]  Sofosbuvir-Velpatasvir (EPCLUSA) 400-100 MG TABS Take 1 tablet by mouth daily. 04/17/22   Esmond Plants,  RPH-CPP    Family History History reviewed. No pertinent family history.  Social History Social History   Tobacco Use   Smoking status: Former    Packs/day: 1.00    Types: Cigarettes   Smokeless tobacco: Never  Vaping Use   Vaping Use: Never used  Substance Use Topics   Alcohol use: Not Currently    Comment: daily use   Drug use: Yes    Frequency: 3.0 times per week    Types: Marijuana, Heroin, IV    Comment: heroin, benzos, alcohol use     Allergies   Patient has no known allergies.   Review of Systems Review of Systems  Constitutional: Negative.   HENT: Negative.    Respiratory: Negative.    Cardiovascular: Negative.   Gastrointestinal: Negative.   Musculoskeletal: Negative.   Psychiatric/Behavioral:  The patient is nervous/anxious.      Physical Exam Triage Vital Signs ED Triage Vitals  Enc Vitals Group     BP 06/27/22 1437 117/62     Pulse Rate 06/27/22 1437 72     Resp 06/27/22 1437 16     Temp 06/27/22 1437 98 F (36.7 C)     Temp Source 06/27/22 1437 Oral     SpO2 06/27/22 1437 95 %     Weight --      Height --      Head Circumference --  Peak Flow --      Pain Score 06/27/22 1435 0     Pain Loc --      Pain Edu? --      Excl. in [place]? --    No data found.  Updated Vital Signs BP 117/62 (BP Location: Right Arm)   Pulse 72   Temp 98 F (36.7 C) (Oral)   Resp 16   SpO2 95%   Visual Acuity Right Eye Distance:   Left Eye Distance:   Bilateral Distance:    Right Eye Near:   Left Eye Near:    Bilateral Near:     Physical Exam Constitutional:      Appearance: Normal appearance.  HENT:     Head: Normocephalic.  Cardiovascular:     Rate and Rhythm: Normal rate.  Pulmonary:     Effort: Pulmonary effort is normal.  Skin:    General: Skin is warm.  Neurological:     General: No focal deficit present.     Mental Status: He is alert.  Psychiatric:        Mood and Affect: Mood normal.      UC Treatments / Results   Labs (all labs ordered are listed, but only abnormal results are displayed) Labs Reviewed - No data to display  EKG   Radiology No results found.  Procedures Procedures (including critical care time)  Medications Ordered in UC Medications - No data to display  Initial Impression / Assessment and Plan / UC Course  I have reviewed the triage vital signs and the nursing notes.  Pertinent labs & imaging results that were available during my care of the patient were reviewed by me and considered in my medical decision making (see chart for details).    Final Clinical Impressions(s) / UC Diagnoses   Final diagnoses:  Encounter for medication refill  Excessive sweating     Discharge Instructions      You were seen today for medication refill.  This has been sent to your pharmacy today.  Please follow up with your primary care provider for further refills.     ED Prescriptions     Medication Sig Dispense Auth. Provider   oxybutynin (DITROPAN) 5 MG tablet Take 1 tablet (5 mg total) by mouth 3 (three) times daily. 90 tablet Rondel Oh, MD      PDMP not reviewed this encounter.   Rondel Oh, MD 06/27/22 1447

## 2022-07-10 ENCOUNTER — Ambulatory Visit: Payer: Self-pay | Admitting: Pharmacist

## 2022-07-10 ENCOUNTER — Encounter: Payer: Self-pay | Admitting: Pharmacist

## 2022-07-25 IMAGING — US US ABDOMEN COMPLETE W/ ELASTOGRAPHY
2 of 3 series · 12 of 25 positions shown · non-contrast
Comparison: None Available.

CLINICAL DATA: Chronic hepatitis C

EXAM:
ULTRASOUND ABDOMEN
ULTRASOUND HEPATIC ELASTOGRAPHY
TECHNIQUE: Sonography of the upper abdomen was performed. In addition,
ultrasound elastography evaluation of the liver was performed. A
region of interest was placed within the right lobe of the liver.
Following application of a compressive sonographic pulse, tissue
compressibility was assessed. Multiple assessments were performed at
the selected site. Median tissue compressibility was determined.
Previously, hepatic stiffness was assessed by shear wave velocity.
Based on recently published Society of Radiologists in Ultrasound
consensus article, reporting is now recommended to be performed in
the SI units of pressure (kiloPascals) representing hepatic
stiffness/elasticity. The obtained result is compared to the
published reference standards. (cACLD = compensated Advanced Chronic
Liver Disease)

[Series 1: us abdomen complete w/elastography · 11 of 102 slices shown (1 of 2)]
[im 5/102]
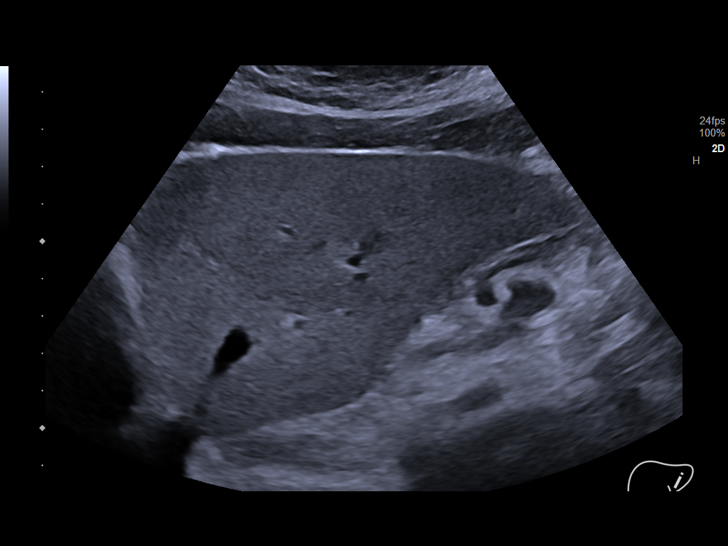
[im 14/102]
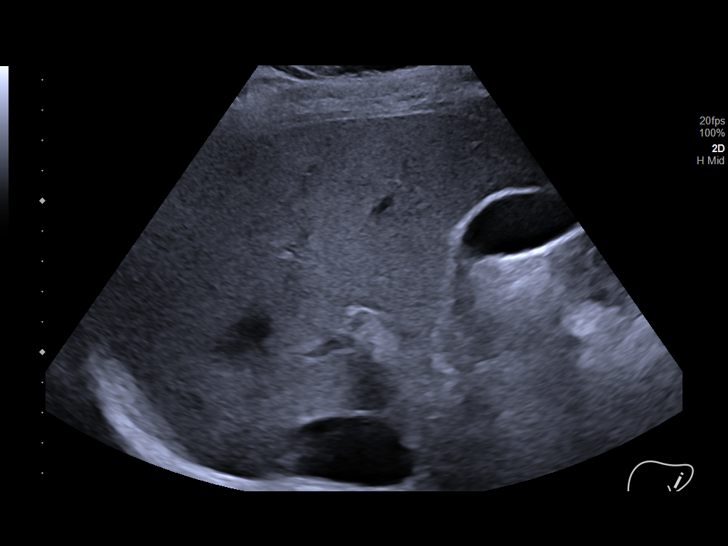
[im 23/102]
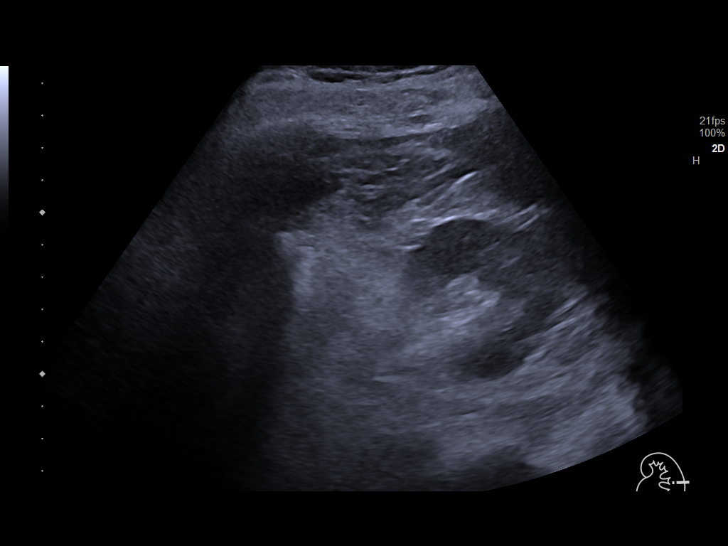
[im 33/102]
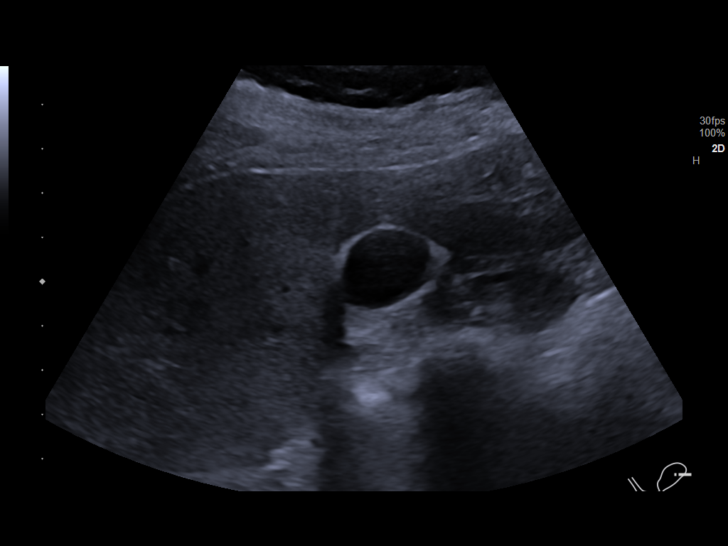
[im 42/102]
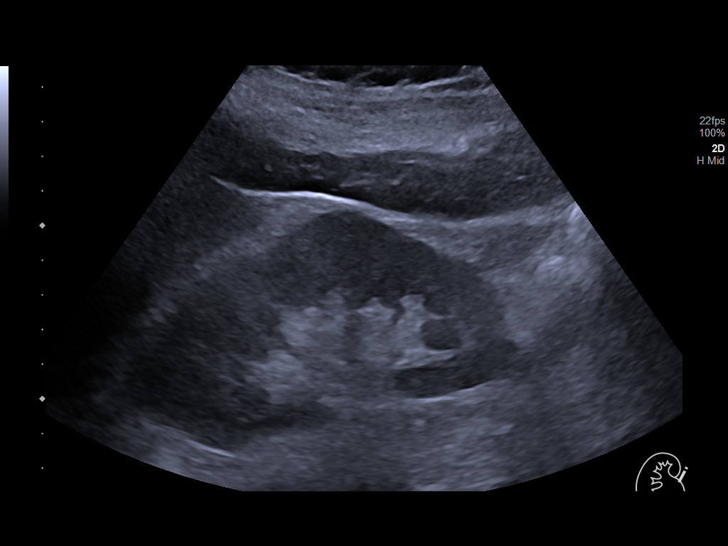
[im 51/102]
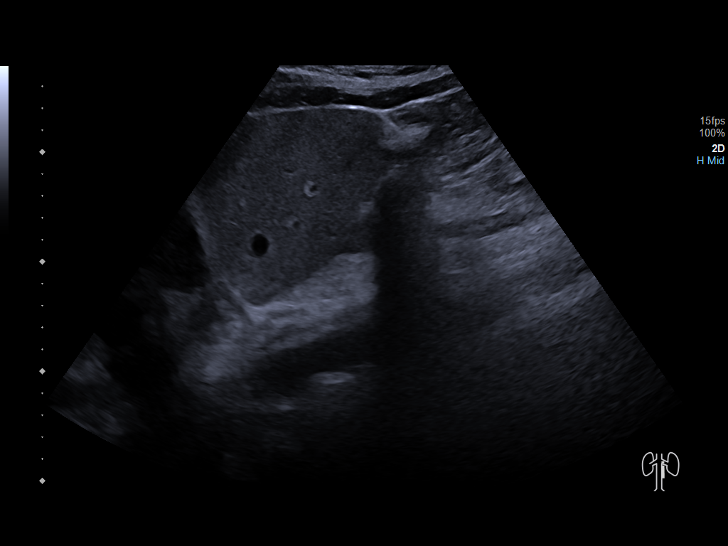
[im 60/102]
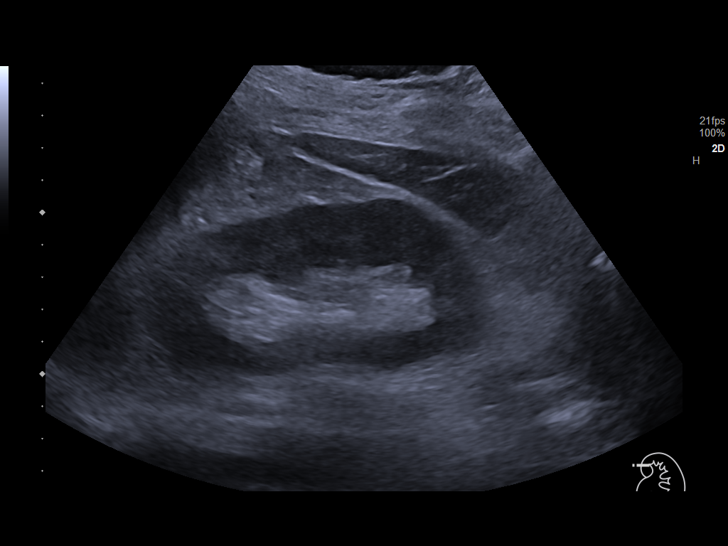
[im 69/102]
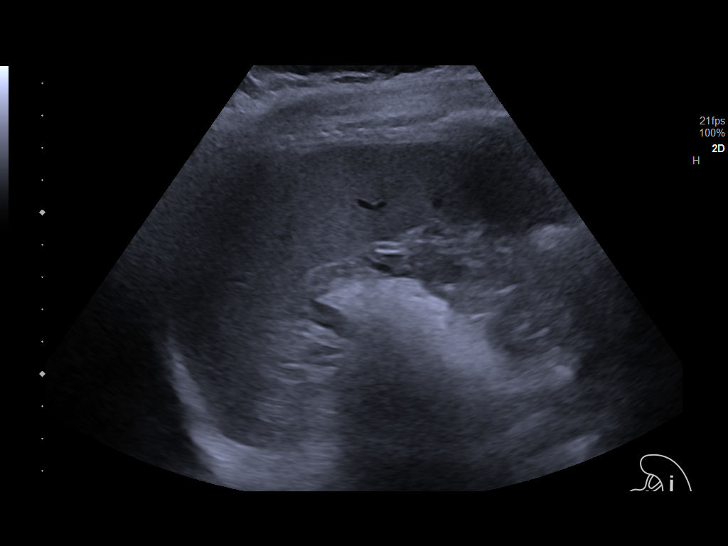
[im 79/102]
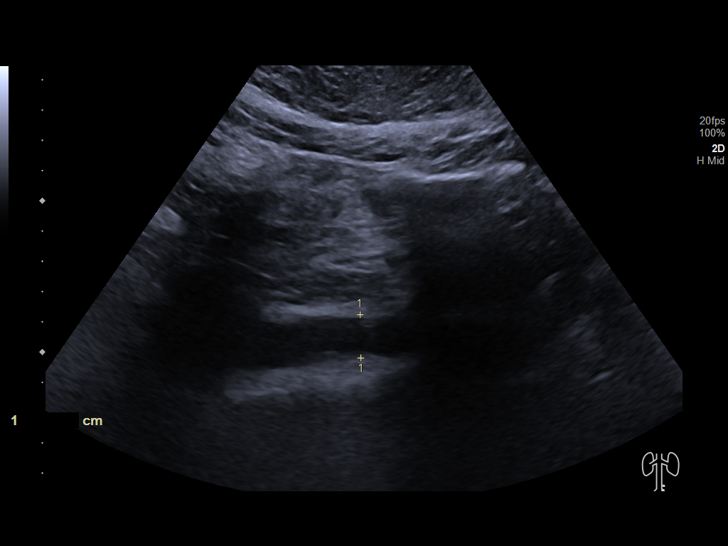
[im 88/102]
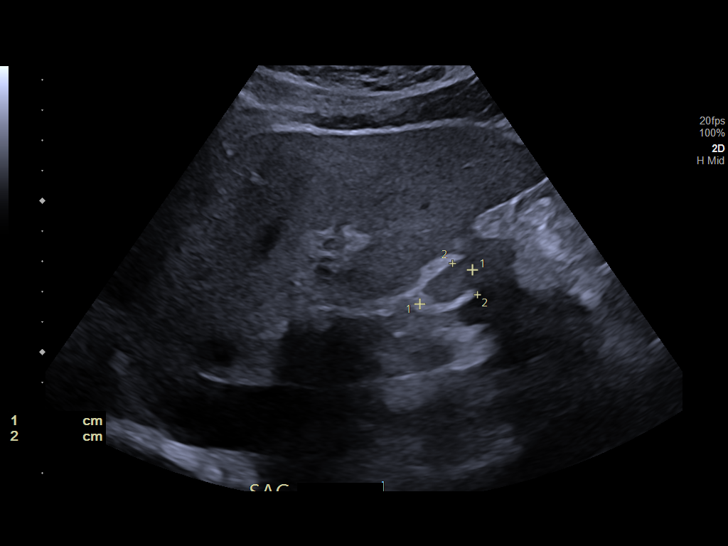
[im 97/102]
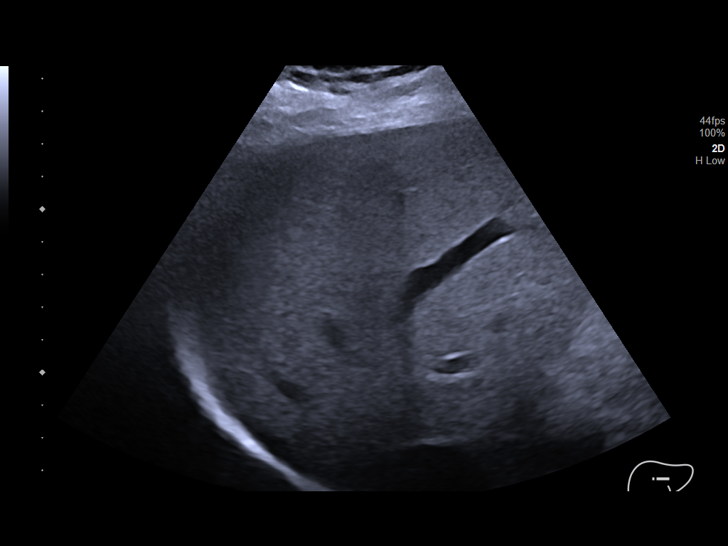

[Series 1001: us abdomen complete w/elastography · 1 of 2 slices shown (2 of 2)]
[im 1/2]
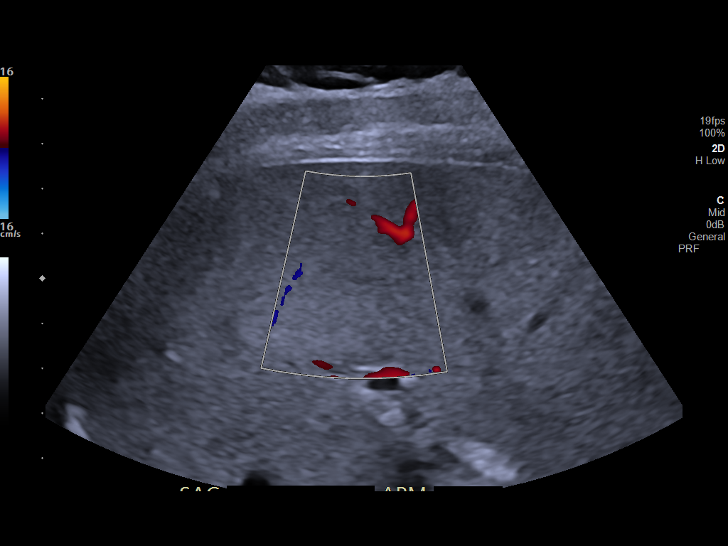

[12 of 25 positions shown; findings below may reference images not displayed]

FINDINGS: ULTRASOUND ABDOMEN

Gallbladder: No gallstones or wall thickening visualized. No
sonographic Murphy sign noted by sonographer.

Common bile duct: Diameter: 0.6 cm

Liver: No focal lesion identified. Nodular contour of the liver.
Within normal limits in parenchymal echogenicity. Portal vein is
patent on color Doppler imaging with normal direction of blood flow
towards the liver.

IVC: No abnormality visualized.

Pancreas: Visualized portion unremarkable.

Spleen: Mild splenomegaly, 13.3 x 12.6 x 4.4 cm, volume 387 mL.

Right Kidney: Length: 10.6 cm. Echogenicity within normal limits. No
mass or hydronephrosis visualized.

Left Kidney: Length: 10.8 cm. Echogenicity within normal limits. No
mass or hydronephrosis visualized.

Abdominal aorta: No aneurysm visualized.

Other findings: Incidental note of a prominent portacaval node.

ULTRASOUND HEPATIC ELASTOGRAPHY

Device: Siemens Helix VTQ

Patient position: Oblique

Transducer C92

Number of measurements: 12

Hepatic segment:  8

Median kPa:

IQR:

IQR/Median kPa ratio:

Data quality: IQR/Median kPa ratio of 0.3 or greater indicates
reduced accuracy

Diagnostic category:  < or = 5 kPa: high probability of being normal

The use of hepatic elastography is applicable to patients with viral
hepatitis and non-alcoholic fatty liver disease. At this time, there
is insufficient data for the referenced cut-off values and use in
other causes of liver disease, including alcoholic liver disease.
Patients, however, may be assessed by elastography and serve as
their own reference standard/baseline.

In patients with non-alcoholic liver disease, the values suggesting
compensated advanced chronic liver disease (cACLD) may be lower, and
patients may need additional testing with elasticity results of [DATE]
kPa.

Please note that abnormal hepatic elasticity and shear wave
velocities may also be identified in clinical settings other than
with hepatic fibrosis, such as: acute hepatitis, elevated right
heart and central venous pressures including use of beta blockers,
Moshaver disease (Zeinab), infiltrative processes such as
mastocytosis/amyloidosis/infiltrative tumor/lymphoma, extrahepatic
cholestasis, with hyperemia in the post-prandial state, and with
liver transplantation. Correlation with patient history, laboratory
data, and clinical condition recommended.

Diagnostic Categories:

< or =5 kPa: high probability of being normal

< or =9 kPa: in the absence of other known clinical signs, rules [DATE] kPa and ?13 kPa: suggestive of cACLD, but needs further testing

>13 kPa: highly suggestive of cACLD

> or =17 kPa: highly suggestive of cACLD with an increased
probability of clinically significant portal hypertension
IMPRESSION: ULTRASOUND ABDOMEN:

1. Somewhat nodular contour of the liver suggesting cirrhosis. No
focal liver lesions.

2.  Mild splenomegaly.

ULTRASOUND HEPATIC ELASTOGRAPHY:

Median kPa:

Diagnostic category:  < or = 5 kPa: high probability of being normal

## 2022-10-15 ENCOUNTER — Encounter: Payer: Self-pay | Admitting: Emergency Medicine

## 2022-10-15 ENCOUNTER — Ambulatory Visit (INDEPENDENT_AMBULATORY_CARE_PROVIDER_SITE_OTHER): Payer: Commercial Managed Care - HMO | Admitting: Emergency Medicine

## 2022-10-15 VITALS — BP 126/74 | HR 72 | Temp 97.8°F | Ht 71.0 in | Wt 229.1 lb

## 2022-10-15 DIAGNOSIS — L989 Disorder of the skin and subcutaneous tissue, unspecified: Secondary | ICD-10-CM | POA: Insufficient documentation

## 2022-10-15 DIAGNOSIS — Z23 Encounter for immunization: Secondary | ICD-10-CM

## 2022-10-15 DIAGNOSIS — F411 Generalized anxiety disorder: Secondary | ICD-10-CM | POA: Diagnosis not present

## 2022-10-15 DIAGNOSIS — E6609 Other obesity due to excess calories: Secondary | ICD-10-CM | POA: Diagnosis not present

## 2022-10-15 DIAGNOSIS — Z6831 Body mass index (BMI) 31.0-31.9, adult: Secondary | ICD-10-CM

## 2022-10-15 DIAGNOSIS — F1120 Opioid dependence, uncomplicated: Secondary | ICD-10-CM | POA: Diagnosis not present

## 2022-10-15 LAB — COMPREHENSIVE METABOLIC PANEL
ALT: 17 U/L (ref 0–53)
AST: 14 U/L (ref 0–37)
Albumin: 4.7 g/dL (ref 3.5–5.2)
Alkaline Phosphatase: 48 U/L (ref 39–117)
BUN: 13 mg/dL (ref 6–23)
CO2: 27 mEq/L (ref 19–32)
Calcium: 9.6 mg/dL (ref 8.4–10.5)
Chloride: 103 mEq/L (ref 96–112)
Creatinine, Ser: 1.04 mg/dL (ref 0.40–1.50)
GFR: 93.08 mL/min (ref >60.00)
Glucose, Bld: 86 mg/dL (ref 70–99)
Potassium: 4 mEq/L (ref 3.5–5.1)
Sodium: 138 mEq/L (ref 135–145)
Total Bilirubin: 0.4 mg/dL (ref 0.2–1.2)
Total Protein: 7.5 g/dL (ref 6.0–8.3)

## 2022-10-15 LAB — CBC WITH DIFFERENTIAL/PLATELET
Basophils Absolute: 0 10*3/uL (ref 0.0–0.1)
Basophils Relative: 0.4 % (ref 0.0–3.0)
Eosinophils Absolute: 0.2 10*3/uL (ref 0.0–0.7)
Eosinophils Relative: 3.6 % (ref 0.0–5.0)
HCT: 40.3 % (ref 39.0–52.0)
Hemoglobin: 13.6 g/dL (ref 13.0–17.0)
Lymphocytes Relative: 47.4 % — ABNORMAL HIGH (ref 12.0–46.0)
Lymphs Abs: 3.2 10*3/uL (ref 0.7–4.0)
MCHC: 33.8 g/dL (ref 30.0–36.0)
MCV: 84.5 fl (ref 78.0–100.0)
Monocytes Absolute: 0.4 10*3/uL (ref 0.1–1.0)
Monocytes Relative: 6.1 % (ref 3.0–12.0)
Neutro Abs: 2.9 10*3/uL (ref 1.4–7.7)
Neutrophils Relative %: 42.5 % — ABNORMAL LOW (ref 43.0–77.0)
Platelets: 238 10*3/uL (ref 150.0–400.0)
RBC: 4.77 Mil/uL (ref 4.22–5.81)
RDW: 13.6 % (ref 11.5–15.5)
WBC: 6.8 10*3/uL (ref 4.0–10.5)

## 2022-10-15 LAB — LIPID PANEL
Cholesterol: 224 mg/dL — ABNORMAL HIGH (ref 0–200)
HDL: 40.3 mg/dL (ref >39.00)
LDL Cholesterol: 158 mg/dL — ABNORMAL HIGH (ref 0–99)
NonHDL: 183.89
Total CHOL/HDL Ratio: 6
Triglycerides: 130 mg/dL (ref 0.0–149.0)
VLDL: 26 mg/dL (ref 0.0–40.0)

## 2022-10-15 LAB — HEMOGLOBIN A1C: Hgb A1c MFr Bld: 5.8 % (ref 4.6–6.5)

## 2022-10-15 MED ORDER — OXYBUTYNIN CHLORIDE 5 MG PO TABS: 5.0000 mg | ORAL_TABLET | Freq: Three times a day (TID) | ORAL | 0 refills | Status: DC

## 2022-10-15 NOTE — Assessment & Plan Note (Signed)
Needs dermatology evaluation

## 2022-10-15 NOTE — Patient Instructions (Signed)
Health Maintenance, Male Adopting a healthy lifestyle and getting preventive care are important in promoting health and wellness. Ask your health care provider about: The right schedule for you to have regular tests and exams. Things you can do on your own to prevent diseases and keep yourself healthy. What should I know about diet, weight, and exercise? Eat a healthy diet  Eat a diet that includes plenty of vegetables, fruits, low-fat dairy products, and lean protein. Do not eat a lot of foods that are high in solid fats, added sugars, or sodium. Maintain a healthy weight Body mass index (BMI) is a measurement that can be used to identify possible weight problems. It estimates body fat based on height and weight. Your health care provider can help determine your BMI and help you achieve or maintain a healthy weight. Get regular exercise Get regular exercise. This is one of the most important things you can do for your health. Most adults should: Exercise for at least 150 minutes each week. The exercise should increase your heart rate and make you sweat (moderate-intensity exercise). Do strengthening exercises at least twice a week. This is in addition to the moderate-intensity exercise. Spend less time sitting. Even light physical activity can be beneficial. Watch cholesterol and blood lipids Have your blood tested for lipids and cholesterol at 36 years of age, then have this test every 5 years. You may need to have your cholesterol levels checked more often if: Your lipid or cholesterol levels are high. You are older than 36 years of age. You are at high risk for heart disease. What should I know about cancer screening? Many types of cancers can be detected early and may often be prevented. Depending on your health history and family history, you may need to have cancer screening at various ages. This may include screening for: Colorectal cancer. Prostate cancer. Skin cancer. Lung  cancer. What should I know about heart disease, diabetes, and high blood pressure? Blood pressure and heart disease High blood pressure causes heart disease and increases the risk of stroke. This is more likely to develop in people who have high blood pressure readings or are overweight. Talk with your health care provider about your target blood pressure readings. Have your blood pressure checked: Every 3-5 years if you are 18-39 years of age. Every year if you are 40 years old or older. If you are between the ages of 65 and 75 and are a current or former smoker, ask your health care provider if you should have a one-time screening for abdominal aortic aneurysm (AAA). Diabetes Have regular diabetes screenings. This checks your fasting blood sugar level. Have the screening done: Once every three years after age 45 if you are at a normal weight and have a low risk for diabetes. More often and at a younger age if you are overweight or have a high risk for diabetes. What should I know about preventing infection? Hepatitis B If you have a higher risk for hepatitis B, you should be screened for this virus. Talk with your health care provider to find out if you are at risk for hepatitis B infection. Hepatitis C Blood testing is recommended for: Everyone born from 1945 through 1965. Anyone with known risk factors for hepatitis C. Sexually transmitted infections (STIs) You should be screened each year for STIs, including gonorrhea and chlamydia, if: You are sexually active and are younger than 36 years of age. You are older than 36 years of age and your   health care provider tells you that you are at risk for this type of infection. Your sexual activity has changed since you were last screened, and you are at increased risk for chlamydia or gonorrhea. Ask your health care provider if you are at risk. Ask your health care provider about whether you are at high risk for HIV. Your health care provider  may recommend a prescription medicine to help prevent HIV infection. If you choose to take medicine to prevent HIV, you should first get tested for HIV. You should then be tested every 3 months for as long as you are taking the medicine. Follow these instructions at home: Alcohol use Do not drink alcohol if your health care provider tells you not to drink. If you drink alcohol: Limit how much you have to 0-2 drinks a day. Know how much alcohol is in your drink. In the [place] one drink equals one 12 oz bottle of beer (355 mL), one 5 oz glass of wine (148 mL), or one 1 oz glass of hard liquor (44 mL). Lifestyle Do not use any products that contain nicotine or tobacco. These products include cigarettes, chewing tobacco, and vaping devices, such as e-cigarettes. If you need help quitting, ask your health care provider. Do not use street drugs. Do not share needles. Ask your health care provider for help if you need support or information about quitting drugs. General instructions Schedule regular health, dental, and eye exams. Stay current with your vaccines. Tell your health care provider if: You often feel depressed. You have ever been abused or do not feel safe at home. Summary Adopting a healthy lifestyle and getting preventive care are important in promoting health and wellness. Follow your health care provider's instructions about healthy diet, exercising, and getting tested or screened for diseases. Follow your health care provider's instructions on monitoring your cholesterol and blood pressure. This information is not intended to replace advice given to you by your health care provider. Make sure you discuss any questions you have with your health care provider. Document Revised: 02/11/2021 Document Reviewed: 02/11/2021 Elsevier Patient Education  2023 [employer]  

## 2022-10-15 NOTE — Assessment & Plan Note (Signed)
Diet and nutrition discussed. Exercise advice given Advised to decrease amount of daily carbohydrate intake and daily calories and increase amount of plant based protein in his diet

## 2022-10-15 NOTE — Assessment & Plan Note (Signed)
Stable.  No concerns. Anxiety management discussed.

## 2022-10-15 NOTE — Progress Notes (Signed)
Devon [name] 37 y.o.   Chief Complaint  Patient presents with   Follow-up    37mnth f/u appt, patient has a mole on his lower abd area, referral to dermatology, patient wants to talk about weight loss options     HISTORY OF PRESENT ILLNESS: This is a 37 y.o. male complaining of lower abdominal skin lesion.  Needs dermatology referral. Also needs weight loss advise. No other complaints or medical concerns today. Wt Readings from Last 3 Encounters:  10/15/22 229 lb 2 oz (103.9 kg)  04/09/22 220 lb 2 oz (99.8 kg)  03/12/22 217 lb (98.4 kg)     HPI   Prior to Admission medications   Medication Sig Start Date End Date Taking? Authorizing Provider  amitriptyline (ELAVIL) 10 MG tablet 1 tablet at bedtime Orally Once a day for 30 day(s)   Yes [provider]  methadone (DOLOPHINE) 10 MG/ML solution 1 mL mixed with water or fruit juice as needed Orally Once a day   Yes [provider]  oxybutynin (DITROPAN) 5 MG tablet Take 1 tablet (5 mg total) by mouth 3 (three) times daily. 06/27/22  Yes Piontek, Erin, MD  Sofosbuvir-Velpatasvir (EPCLUSA) 400-100 MG TABS Take 1 tablet by mouth daily. 04/17/22  Yes Esmond Plants, RPH-CPP    No Known Allergies  Patient Active Problem List   Diagnosis Date Noted   Polysubstance abuse ([place]) 03/13/2022   Smoking 03/13/2022   Chronic hepatitis C without hepatic coma ([place]) 03/06/2022   Adjustment disorder with mixed anxiety and depressed mood 09/06/2018   Cannabis abuse 07/21/2016   Cocaine dependence without complication ([place]) 61/95/0932   Stimulant abuse ([place]) 67/09/4579   Uncomplicated alcohol dependence ([place]) 99/83/3825   Uncomplicated opioid dependence ([place]) 07/21/2016   TOBACCO ABUSE 08/22/2008   ASTHMA 08/22/2008    Past Medical History:  Diagnosis Date   Asthma    Depression 09/06/2018   Hepatitis    Poly-drug misuser    heroin, marj, benzo, etoh,     Past Surgical History:  Procedure Laterality Date   ORIF  CLAVICLE FRACTURE  2011    Social History   Socioeconomic History   Marital status: Single    Spouse name: Not on file   Number of children: Not on file   Years of education: Not on file   Highest education level: Not on file  Occupational History   Not on file  Tobacco Use   Smoking status: Former    Packs/day: 1.00    Types: Cigarettes   Smokeless tobacco: Never  Vaping Use   Vaping Use: Never used  Substance and Sexual Activity   Alcohol use: Not Currently    Comment: daily use   Drug use: Yes    Frequency: 3.0 times per week    Types: Marijuana, Heroin, IV    Comment: heroin, benzos, alcohol use   Sexual activity: Yes  Other Topics Concern   Not on file  Social History Narrative   Not on file   Social Determinants of Health   Financial Resource Strain: Not on file  Food Insecurity: Not on file  Transportation Needs: Not on file  Physical Activity: Not on file  Stress: Not on file  Social Connections: Not on file  Intimate Partner Violence: Not on file    No family history on file.   Review of Systems  Constitutional: Negative.  Negative for chills and fever.  HENT: Negative.  Negative for congestion and sore throat.   Respiratory: Negative.  Negative for cough and shortness of breath.   Cardiovascular: Negative.  Negative for chest pain and palpitations.  Gastrointestinal:  Negative for abdominal pain, diarrhea, nausea and vomiting.  Skin: Negative.  Negative for rash.  Neurological: Negative.  Negative for dizziness and headaches.  All other systems reviewed and are negative.  Today's Vitals   10/15/22 1352  BP: 126/74  Pulse: 72  Temp: 97.8 F (36.6 C)  TempSrc: Oral  SpO2: 93%  Weight: 229 lb 2 oz (103.9 kg)  Height: 5\' 11"  (1.803 m)   Body mass index is 31.96 kg/m.   Physical Exam Vitals reviewed.  Constitutional:      Appearance: Normal appearance.  HENT:     Head: Normocephalic.  Eyes:     Extraocular Movements: Extraocular  movements intact.  Cardiovascular:     Rate and Rhythm: Normal rate and regular rhythm.     Pulses: Normal pulses.     Heart sounds: Normal heart sounds.  Pulmonary:     Effort: Pulmonary effort is normal.     Breath sounds: Normal breath sounds.  Musculoskeletal:     Cervical back: No tenderness.  Lymphadenopathy:     Cervical: No cervical adenopathy.  Skin:    General: Skin is warm and dry.  Neurological:     General: No focal deficit present.     Mental Status: He is alert and oriented to person, place, and time.  Psychiatric:        Mood and Affect: Mood normal.        Behavior: Behavior normal.      ASSESSMENT & PLAN: A total of 42 minutes was spent with the patient and counseling/coordination of care regarding preparing for this visit, review of most recent office visit notes, review of most recent blood work results, review of all medications, education on nutrition and benefits of exercise, prognosis, documentation, and need for follow-up.  Problem List Items Addressed This Visit       Musculoskeletal and Integument   Skin lesion    Needs dermatology evaluation      Relevant Orders   Ambulatory referral to Dermatology     Other   Uncomplicated opioid dependence (HCC)    Attempting to get off methadone. Continues to take Ditropan 5 mg 3 times a day      Class 1 obesity due to excess calories without serious comorbidity with body mass index (BMI) of 31.0 to 31.9 in adult - Primary    Diet and nutrition discussed. Exercise advice given Advised to decrease amount of daily carbohydrate intake and daily calories and increase amount of plant based protein in his diet      Relevant Orders   CBC with Differential/Platelet   Comprehensive metabolic panel   Hemoglobin A1c   Lipid panel   Generalized anxiety disorder    Stable.  No concerns. Anxiety management discussed.      Patient Instructions  Health Maintenance, Male Adopting a healthy lifestyle and  getting preventive care are important in promoting health and wellness. Ask your health care provider about: The right schedule for you to have regular tests and exams. Things you can do on your own to prevent diseases and keep yourself healthy. What should I know about diet, weight, and exercise? Eat a healthy diet  Eat a diet that includes plenty of vegetables, fruits, low-fat dairy products, and lean protein. Do not eat a lot of foods that are high in solid fats, added sugars, or sodium. Maintain a healthy  weight Body mass index (BMI) is a measurement that can be used to identify possible weight problems. It estimates body fat based on height and weight. Your health care provider can help determine your BMI and help you achieve or maintain a healthy weight. Get regular exercise Get regular exercise. This is one of the most important things you can do for your health. Most adults should: Exercise for at least 150 minutes each week. The exercise should increase your heart rate and make you sweat (moderate-intensity exercise). Do strengthening exercises at least twice a week. This is in addition to the moderate-intensity exercise. Spend less time sitting. Even light physical activity can be beneficial. Watch cholesterol and blood lipids Have your blood tested for lipids and cholesterol at 36 years of age, then have this test every 5 years. You may need to have your cholesterol levels checked more often if: Your lipid or cholesterol levels are high. You are older than 36 years of age. You are at high risk for heart disease. What should I know about cancer screening? Many types of cancers can be detected early and may often be prevented. Depending on your health history and family history, you may need to have cancer screening at various ages. This may include screening for: Colorectal cancer. Prostate cancer. Skin cancer. Lung cancer. What should I know about heart disease, diabetes, and  high blood pressure? Blood pressure and heart disease High blood pressure causes heart disease and increases the risk of stroke. This is more likely to develop in people who have high blood pressure readings or are overweight. Talk with your health care provider about your target blood pressure readings. Have your blood pressure checked: Every 3-5 years if you are 96-22 years of age. Every year if you are 21 years old or older. If you are between the ages of 73 and 28 and are a current or former smoker, ask your health care provider if you should have a one-time screening for abdominal aortic aneurysm (AAA). Diabetes Have regular diabetes screenings. This checks your fasting blood sugar level. Have the screening done: Once every three years after age 13 if you are at a normal weight and have a low risk for diabetes. More often and at a younger age if you are overweight or have a high risk for diabetes. What should I know about preventing infection? Hepatitis B If you have a higher risk for hepatitis B, you should be screened for this virus. Talk with your health care provider to find out if you are at risk for hepatitis B infection. Hepatitis C Blood testing is recommended for: Everyone born from [birth year] through 1964. Anyone with known risk factors for hepatitis C. Sexually transmitted infections (STIs) You should be screened each year for STIs, including gonorrhea and chlamydia, if: You are sexually active and are younger than 36 years of age. You are older than 36 years of age and your health care provider tells you that you are at risk for this type of infection. Your sexual activity has changed since you were last screened, and you are at increased risk for chlamydia or gonorrhea. Ask your health care provider if you are at risk. Ask your health care provider about whether you are at high risk for HIV. Your health care provider may recommend a prescription medicine to help prevent HIV  infection. If you choose to take medicine to prevent HIV, you should first get tested for HIV. You should then be tested every 3 months  for as long as you are taking the medicine. Follow these instructions at home: Alcohol use Do not drink alcohol if your health care provider tells you not to drink. If you drink alcohol: Limit how much you have to 0-2 drinks a day. Know how much alcohol is in your drink. In the [place] one drink equals one 12 oz bottle of beer (355 mL), one 5 oz glass of wine (148 mL), or one 1 oz glass of hard liquor (44 mL). Lifestyle Do not use any products that contain nicotine or tobacco. These products include cigarettes, chewing tobacco, and vaping devices, such as e-cigarettes. If you need help quitting, ask your health care provider. Do not use street drugs. Do not share needles. Ask your health care provider for help if you need support or information about quitting drugs. General instructions Schedule regular health, dental, and eye exams. Stay current with your vaccines. Tell your health care provider if: You often feel depressed. You have ever been abused or do not feel safe at home. Summary Adopting a healthy lifestyle and getting preventive care are important in promoting health and wellness. Follow your health care provider's instructions about healthy diet, exercising, and getting tested or screened for diseases. Follow your health care provider's instructions on monitoring your cholesterol and blood pressure. This information is not intended to replace advice given to you by your health care provider. Make sure you discuss any questions you have with your health care provider. Document Revised: 02/11/2021 Document Reviewed: 02/11/2021 Elsevier Patient Education  2023 Elsevier Inc.    Edwina Barth, MD [facility]

## 2022-10-15 NOTE — Assessment & Plan Note (Signed)
Attempting to get off methadone. Continues to take Ditropan 5 mg 3 times a day

## 2022-11-20 ENCOUNTER — Other Ambulatory Visit: Payer: Self-pay | Admitting: Emergency Medicine

## 2023-04-15 ENCOUNTER — Ambulatory Visit: Payer: Commercial Managed Care - HMO | Admitting: Emergency Medicine

## 2023-04-22 ENCOUNTER — Telehealth: Payer: Self-pay

## 2023-04-22 NOTE — Transitions of Care (Post Inpatient/ED Visit) (Signed)
   04/22/2023  Name: Devon [name] MRN: [id number] DOB: [birth year]  Today's TOC FU Call Status: Today's TOC FU Call Status:: Unsuccessul Call (1st Attempt) Unsuccessful Call (1st Attempt) Date: 04/22/23  Attempted to reach the patient regarding the most recent Inpatient/ED visit.  Follow Up Plan: Additional outreach attempts will be made to reach the patient to complete the Transitions of Care (Post Inpatient/ED visit) call.   Signature   Woodfin Ganja LPN [facility] Nurse Health Advisor Direct Dial [phone]

## 2023-04-23 NOTE — Transitions of Care (Post Inpatient/ED Visit) (Unsigned)
   04/23/2023  Name: Devon [name] MRN: [id number] DOB: 01-Aug-1986  Today's TOC FU Call Status: Today's TOC FU Call Status:: Unsuccessful Call (2nd Attempt) Unsuccessful Call (1st Attempt) Date: 04/22/23 Unsuccessful Call (2nd Attempt) Date: 04/23/23  Attempted to reach the patient regarding the most recent Inpatient/ED visit.  Follow Up Plan: Additional outreach attempts will be made to reach the patient to complete the Transitions of Care (Post Inpatient/ED visit) call.   Signature   Woodfin Ganja LPN [facility] Nurse Health Advisor Direct Dial [phone]

## 2023-04-27 NOTE — Transitions of Care (Post Inpatient/ED Visit) (Signed)
   04/27/2023  Name: CUYLER VANDYKEN MRN: [id number] DOB: [birth year]  Today's TOC FU Call Status: Today's TOC FU Call Status:: Successful TOC FU Call Competed Unsuccessful Call (1st Attempt) Date: 04/22/23 Unsuccessful Call (2nd Attempt) Date: 04/23/23 Unsuccessful Call (3rd Attempt) Date: 04/27/23 [facility] FU Call Complete Date: 04/27/23  Attempted to reach the patient regarding the most recent Inpatient/ED visit.  Follow Up Plan: No further outreach attempts will be made at this time. We have been unable to contact the patient.  Signature   Woodfin Ganja LPN [facility] Nurse Health Advisor Direct Dial [phone]

## 2023-04-28 ENCOUNTER — Encounter: Payer: Self-pay | Admitting: Emergency Medicine

## 2023-04-28 ENCOUNTER — Ambulatory Visit (INDEPENDENT_AMBULATORY_CARE_PROVIDER_SITE_OTHER): Payer: Commercial Managed Care - HMO | Admitting: Emergency Medicine

## 2023-04-28 VITALS — BP 124/72 | HR 75 | Temp 97.8°F | Ht 71.0 in | Wt 204.3 lb

## 2023-04-28 DIAGNOSIS — A09 Infectious gastroenteritis and colitis, unspecified: Secondary | ICD-10-CM | POA: Diagnosis not present

## 2023-04-28 DIAGNOSIS — Z09 Encounter for follow-up examination after completed treatment for conditions other than malignant neoplasm: Secondary | ICD-10-CM

## 2023-04-28 LAB — COMPREHENSIVE METABOLIC PANEL
ALT: 21 U/L (ref 0–53)
AST: 15 U/L (ref 0–37)
Albumin: 4.3 g/dL (ref 3.5–5.2)
Alkaline Phosphatase: 37 U/L — ABNORMAL LOW (ref 39–117)
BUN: 12 mg/dL (ref 6–23)
CO2: 29 mEq/L (ref 19–32)
Calcium: 9.5 mg/dL (ref 8.4–10.5)
Chloride: 103 mEq/L (ref 96–112)
Creatinine, Ser: 0.82 mg/dL (ref 0.40–1.50)
GFR: 113.45 mL/min (ref >60.00)
Glucose, Bld: 100 mg/dL — ABNORMAL HIGH (ref 70–99)
Potassium: 4.2 mEq/L (ref 3.5–5.1)
Sodium: 140 mEq/L (ref 135–145)
Total Bilirubin: 0.2 mg/dL (ref 0.2–1.2)
Total Protein: 7.4 g/dL (ref 6.0–8.3)

## 2023-04-28 LAB — CBC WITH DIFFERENTIAL/PLATELET
Basophils Absolute: 0 10*3/uL (ref 0.0–0.1)
Basophils Relative: 0.4 % (ref 0.0–3.0)
Eosinophils Absolute: 0.3 10*3/uL (ref 0.0–0.7)
Eosinophils Relative: 4.7 % (ref 0.0–5.0)
HCT: 38.8 % — ABNORMAL LOW (ref 39.0–52.0)
Hemoglobin: 12.6 g/dL — ABNORMAL LOW (ref 13.0–17.0)
Lymphocytes Relative: 45.2 % (ref 12.0–46.0)
Lymphs Abs: 3.2 10*3/uL (ref 0.7–4.0)
MCHC: 32.5 g/dL (ref 30.0–36.0)
MCV: 83.6 fl (ref 78.0–100.0)
Monocytes Absolute: 0.4 10*3/uL (ref 0.1–1.0)
Monocytes Relative: 6.3 % (ref 3.0–12.0)
Neutro Abs: 3.1 10*3/uL (ref 1.4–7.7)
Neutrophils Relative %: 43.4 % (ref 43.0–77.0)
Platelets: 402 10*3/uL — ABNORMAL HIGH (ref 150.0–400.0)
RBC: 4.64 Mil/uL (ref 4.22–5.81)
RDW: 14.4 % (ref 11.5–15.5)
WBC: 7 10*3/uL (ref 4.0–10.5)

## 2023-04-28 LAB — URINALYSIS
Bilirubin Urine: NEGATIVE
Hgb urine dipstick: NEGATIVE
Ketones, ur: NEGATIVE
Leukocytes,Ua: NEGATIVE
Nitrite: NEGATIVE
Specific Gravity, Urine: 1.02 (ref 1.000–1.030)
Total Protein, Urine: NEGATIVE
Urine Glucose: NEGATIVE
Urobilinogen, UA: 0.2 (ref 0.0–1.0)
pH: 6 (ref 5.0–8.0)

## 2023-04-28 NOTE — Patient Instructions (Signed)
Health Maintenance, Male Adopting a healthy lifestyle and getting preventive care are important in promoting health and wellness. Ask your health care provider about: The right schedule for you to have regular tests and exams. Things you can do on your own to prevent diseases and keep yourself healthy. What should I know about diet, weight, and exercise? Eat a healthy diet  Eat a diet that includes plenty of vegetables, fruits, low-fat dairy products, and lean protein. Do not eat a lot of foods that are high in solid fats, added sugars, or sodium. Maintain a healthy weight Body mass index (BMI) is a measurement that can be used to identify possible weight problems. It estimates body fat based on height and weight. Your health care provider can help determine your BMI and help you achieve or maintain a healthy weight. Get regular exercise Get regular exercise. This is one of the most important things you can do for your health. Most adults should: Exercise for at least 150 minutes each week. The exercise should increase your heart rate and make you sweat (moderate-intensity exercise). Do strengthening exercises at least twice a week. This is in addition to the moderate-intensity exercise. Spend less time sitting. Even light physical activity can be beneficial. Watch cholesterol and blood lipids Have your blood tested for lipids and cholesterol at 36 years of age, then have this test every 5 years. You may need to have your cholesterol levels checked more often if: Your lipid or cholesterol levels are high. You are older than 36 years of age. You are at high risk for heart disease. What should I know about cancer screening? Many types of cancers can be detected early and may often be prevented. Depending on your health history and family history, you may need to have cancer screening at various ages. This may include screening for: Colorectal cancer. Prostate cancer. Skin cancer. Lung  cancer. What should I know about heart disease, diabetes, and high blood pressure? Blood pressure and heart disease High blood pressure causes heart disease and increases the risk of stroke. This is more likely to develop in people who have high blood pressure readings or are overweight. Talk with your health care provider about your target blood pressure readings. Have your blood pressure checked: Every 3-5 years if you are 18-39 years of age. Every year if you are 40 years old or older. If you are between the ages of 65 and 75 and are a current or former smoker, ask your health care provider if you should have a one-time screening for abdominal aortic aneurysm (AAA). Diabetes Have regular diabetes screenings. This checks your fasting blood sugar level. Have the screening done: Once every three years after age 45 if you are at a normal weight and have a low risk for diabetes. More often and at a younger age if you are overweight or have a high risk for diabetes. What should I know about preventing infection? Hepatitis B If you have a higher risk for hepatitis B, you should be screened for this virus. Talk with your health care provider to find out if you are at risk for hepatitis B infection. Hepatitis C Blood testing is recommended for: Everyone born from 1945 through 1965. Anyone with known risk factors for hepatitis C. Sexually transmitted infections (STIs) You should be screened each year for STIs, including gonorrhea and chlamydia, if: You are sexually active and are younger than 36 years of age. You are older than 36 years of age and your   health care provider tells you that you are at risk for this type of infection. Your sexual activity has changed since you were last screened, and you are at increased risk for chlamydia or gonorrhea. Ask your health care provider if you are at risk. Ask your health care provider about whether you are at high risk for HIV. Your health care provider  may recommend a prescription medicine to help prevent HIV infection. If you choose to take medicine to prevent HIV, you should first get tested for HIV. You should then be tested every 3 months for as long as you are taking the medicine. Follow these instructions at home: Alcohol use Do not drink alcohol if your health care provider tells you not to drink. If you drink alcohol: Limit how much you have to 0-2 drinks a day. Know how much alcohol is in your drink. In the [place] one drink equals one 12 oz bottle of beer (355 mL), one 5 oz glass of wine (148 mL), or one 1 oz glass of hard liquor (44 mL). Lifestyle Do not use any products that contain nicotine or tobacco. These products include cigarettes, chewing tobacco, and vaping devices, such as e-cigarettes. If you need help quitting, ask your health care provider. Do not use street drugs. Do not share needles. Ask your health care provider for help if you need support or information about quitting drugs. General instructions Schedule regular health, dental, and eye exams. Stay current with your vaccines. Tell your health care provider if: You often feel depressed. You have ever been abused or do not feel safe at home. Summary Adopting a healthy lifestyle and getting preventive care are important in promoting health and wellness. Follow your health care provider's instructions about healthy diet, exercising, and getting tested or screened for diseases. Follow your health care provider's instructions on monitoring your cholesterol and blood pressure. This information is not intended to replace advice given to you by your health care provider. Make sure you discuss any questions you have with your health care provider. Document Revised: 02/11/2021 Document Reviewed: 02/11/2021 Elsevier Patient Education  2024 Elsevier Inc.  

## 2023-04-28 NOTE — Progress Notes (Signed)
Devon [name] 37 y.o.   Chief Complaint  Patient presents with   Hospitalization Follow-up    Patient was admitted to the hospital for colitis and he became septic.  Patient states he had some hematuria but it has cleared up. Finished his abx.     HISTORY OF PRESENT ILLNESS: This is a 37 y.o. male A1A here for hospital discharge follow-up 2 weeks ago was admitted to hospital in [facility] when he presented with abdominal pain, fever, chills nausea and vomiting and blood in the urine Symptoms started about 6 hours after eating suspicious food. Released 5 days later with diagnosis of septic colitis Just finished course of Flagyl and Cipro Feeling about 90% better.   HPI   Prior to Admission medications   Medication Sig Start Date End Date Taking? Authorizing Provider  methadone (DOLOPHINE) 10 MG/ML solution 1 mL mixed with water or fruit juice as needed Orally Once a day   Yes [provider]  oxybutynin (DITROPAN) 5 MG tablet TAKE 1 TABLET(5 MG) BY MOUTH THREE TIMES DAILY 11/20/22  Yes Greysin Medlen, Eilleen Kempf, MD  amitriptyline (ELAVIL) 10 MG tablet 1 tablet at bedtime Orally Once a day for 30 day(s) Patient not taking: Reported on 04/28/2023    [provider]  Sofosbuvir-Velpatasvir (EPCLUSA) 400-100 MG TABS Take 1 tablet by mouth daily. Patient not taking: Reported on 04/28/2023 04/17/22   Jennette Kettle, RPH-CPP    No Known Allergies  Patient Active Problem List   Diagnosis Date Noted   Class 1 obesity due to excess calories without serious comorbidity with body mass index (BMI) of 31.0 to 31.9 in adult 10/15/2022   Generalized anxiety disorder 10/15/2022   Skin lesion 10/15/2022   Polysubstance abuse (HCC) 03/13/2022   Smoking 03/13/2022   Chronic hepatitis C without hepatic coma (HCC) 03/06/2022   Adjustment disorder with mixed anxiety and depressed mood 09/06/2018   Cannabis abuse 07/21/2016   Cocaine dependence without complication  (HCC) 07/21/2016   Stimulant abuse (HCC) 07/21/2016   Uncomplicated alcohol dependence (HCC) 07/21/2016   Uncomplicated opioid dependence (HCC) 07/21/2016   TOBACCO ABUSE 08/22/2008   ASTHMA 08/22/2008    Past Medical History:  Diagnosis Date   Asthma    Depression 09/06/2018   Hepatitis    Poly-drug misuser    heroin, marj, benzo, etoh,     Past Surgical History:  Procedure Laterality Date   ORIF CLAVICLE FRACTURE  2011    Social History   Socioeconomic History   Marital status: Single    Spouse name: Not on file   Number of children: Not on file   Years of education: Not on file   Highest education level: Not on file  Occupational History   Not on file  Tobacco Use   Smoking status: Former    Current packs/day: 1.00    Types: Cigarettes   Smokeless tobacco: Never  Vaping Use   Vaping status: Never Used  Substance and Sexual Activity   Alcohol use: Not Currently    Comment: daily use   Drug use: Yes    Frequency: 3.0 times per week    Types: Marijuana, Heroin, IV    Comment: heroin, benzos, alcohol use   Sexual activity: Yes  Other Topics Concern   Not on file  Social History Narrative   Not on file   Social Determinants of Health   Financial Resource Strain: Not on file  Food Insecurity: Not on file  Transportation Needs: Not on  file  Physical Activity: Not on file  Stress: Not on file  Social Connections: Not on file  Intimate Partner Violence: Not on file    No family history on file.   Review of Systems  Constitutional: Negative.  Negative for chills and fever.  HENT: Negative.  Negative for congestion and sore throat.   Respiratory: Negative.  Negative for cough and shortness of breath.   Cardiovascular: Negative.  Negative for chest pain and palpitations.  Gastrointestinal:  Negative for abdominal pain, diarrhea, nausea and vomiting.  Genitourinary: Negative.  Negative for dysuria.  Skin: Negative.  Negative for rash.  Neurological:  Negative.  Negative for dizziness and headaches.  All other systems reviewed and are negative.   Vitals:   04/28/23 0944  BP: 124/72  Pulse: 75  Temp: 97.8 F (36.6 C)  SpO2: 97%    Physical Exam Vitals reviewed.  Constitutional:      Appearance: Normal appearance.  HENT:     Head: Normocephalic.     Mouth/Throat:     Mouth: Mucous membranes are moist.     Pharynx: Oropharynx is clear.  Eyes:     Extraocular Movements: Extraocular movements intact.     Conjunctiva/sclera: Conjunctivae normal.     Pupils: Pupils are equal, round, and reactive to light.  Cardiovascular:     Rate and Rhythm: Normal rate and regular rhythm.     Pulses: Normal pulses.     Heart sounds: Normal heart sounds.  Pulmonary:     Effort: Pulmonary effort is normal.     Breath sounds: Normal breath sounds.  Abdominal:     Palpations: Abdomen is soft.     Tenderness: There is no abdominal tenderness.  Musculoskeletal:     Cervical back: No tenderness.  Lymphadenopathy:     Cervical: No cervical adenopathy.  Skin:    General: Skin is warm and dry.     Capillary Refill: Capillary refill takes less than 2 seconds.  Neurological:     General: No focal deficit present.     Mental Status: He is alert and oriented to person, place, and time.  Psychiatric:        Mood and Affect: Mood normal.        Behavior: Behavior normal.      ASSESSMENT & PLAN: A total of 43 minutes was spent with the patient and counseling/coordination of care regarding preparing for this visit, review of most recent office visit notes, review of most recent blood work results, diagnosis of septic colitis management, education on nutrition, prognosis, documentation and need for follow-up.  Problem List Items Addressed This Visit       Digestive   Septic colitis - Primary    About 90% better No complications Clinical stable. Just finished antibiotics Cipro and metronidazole Able to eat and drink.  Afebrile.  No longer  nauseous Benign physical examination. Benign abdomen. Blood work done today Diet and nutrition discussed.  Importance of staying well-hydrated discussed.      Relevant Orders   CBC with Differential/Platelet   Comprehensive metabolic panel   Urinalysis   Other Visit Diagnoses     Hospital discharge follow-up       Relevant Orders   CBC with Differential/Platelet   Comprehensive metabolic panel   Urinalysis      Patient Instructions  Health Maintenance, Male Adopting a healthy lifestyle and getting preventive care are important in promoting health and wellness. Ask your health care provider about: The right schedule for you  to have regular tests and exams. Things you can do on your own to prevent diseases and keep yourself healthy. What should I know about diet, weight, and exercise? Eat a healthy diet  Eat a diet that includes plenty of vegetables, fruits, low-fat dairy products, and lean protein. Do not eat a lot of foods that are high in solid fats, added sugars, or sodium. Maintain a healthy weight Body mass index (BMI) is a measurement that can be used to identify possible weight problems. It estimates body fat based on height and weight. Your health care provider can help determine your BMI and help you achieve or maintain a healthy weight. Get regular exercise Get regular exercise. This is one of the most important things you can do for your health. Most adults should: Exercise for at least 150 minutes each week. The exercise should increase your heart rate and make you sweat (moderate-intensity exercise). Do strengthening exercises at least twice a week. This is in addition to the moderate-intensity exercise. Spend less time sitting. Even light physical activity can be beneficial. Watch cholesterol and blood lipids Have your blood tested for lipids and cholesterol at 36 years of age, then have this test every 5 years. You may need to have your cholesterol levels  checked more often if: Your lipid or cholesterol levels are high. You are older than 36 years of age. You are at high risk for heart disease. What should I know about cancer screening? Many types of cancers can be detected early and may often be prevented. Depending on your health history and family history, you may need to have cancer screening at various ages. This may include screening for: Colorectal cancer. Prostate cancer. Skin cancer. Lung cancer. What should I know about heart disease, diabetes, and high blood pressure? Blood pressure and heart disease High blood pressure causes heart disease and increases the risk of stroke. This is more likely to develop in people who have high blood pressure readings or are overweight. Talk with your health care provider about your target blood pressure readings. Have your blood pressure checked: Every 3-5 years if you are 53-32 years of age. Every year if you are 55 years old or older. If you are between the ages of 68 and 10 and are a current or former smoker, ask your health care provider if you should have a one-time screening for abdominal aortic aneurysm (AAA). Diabetes Have regular diabetes screenings. This checks your fasting blood sugar level. Have the screening done: Once every three years after age 36 if you are at a normal weight and have a low risk for diabetes. More often and at a younger age if you are overweight or have a high risk for diabetes. What should I know about preventing infection? Hepatitis B If you have a higher risk for hepatitis B, you should be screened for this virus. Talk with your health care provider to find out if you are at risk for hepatitis B infection. Hepatitis C Blood testing is recommended for: Everyone born from [birth year] through 1964. Anyone with known risk factors for hepatitis C. Sexually transmitted infections (STIs) You should be screened each year for STIs, including gonorrhea and chlamydia,  if: You are sexually active and are younger than 36 years of age. You are older than 36 years of age and your health care provider tells you that you are at risk for this type of infection. Your sexual activity has changed since you were last screened, and you are  at increased risk for chlamydia or gonorrhea. Ask your health care provider if you are at risk. Ask your health care provider about whether you are at high risk for HIV. Your health care provider may recommend a prescription medicine to help prevent HIV infection. If you choose to take medicine to prevent HIV, you should first get tested for HIV. You should then be tested every 3 months for as long as you are taking the medicine. Follow these instructions at home: Alcohol use Do not drink alcohol if your health care provider tells you not to drink. If you drink alcohol: Limit how much you have to 0-2 drinks a day. Know how much alcohol is in your drink. In the [place] one drink equals one 12 oz bottle of beer (355 mL), one 5 oz glass of wine (148 mL), or one 1 oz glass of hard liquor (44 mL). Lifestyle Do not use any products that contain nicotine or tobacco. These products include cigarettes, chewing tobacco, and vaping devices, such as e-cigarettes. If you need help quitting, ask your health care provider. Do not use street drugs. Do not share needles. Ask your health care provider for help if you need support or information about quitting drugs. General instructions Schedule regular health, dental, and eye exams. Stay current with your vaccines. Tell your health care provider if: You often feel depressed. You have ever been abused or do not feel safe at home. Summary Adopting a healthy lifestyle and getting preventive care are important in promoting health and wellness. Follow your health care provider's instructions about healthy diet, exercising, and getting tested or screened for diseases. Follow your health care provider's  instructions on monitoring your cholesterol and blood pressure. This information is not intended to replace advice given to you by your health care provider. Make sure you discuss any questions you have with your health care provider. Document Revised: 02/11/2021 Document Reviewed: 02/11/2021 Elsevier Patient Education  2024 Elsevier Inc.     Edwina Barth, MD [facility]

## 2023-04-28 NOTE — Assessment & Plan Note (Signed)
About 90% better No complications Clinical stable. Just finished antibiotics Cipro and metronidazole Able to eat and drink.  Afebrile.  No longer nauseous Benign physical examination. Benign abdomen. Blood work done today Diet and nutrition discussed.  Importance of staying well-hydrated discussed.

## 2023-10-29 ENCOUNTER — Ambulatory Visit: Payer: MEDICAID | Admitting: Emergency Medicine

## 2023-12-10 ENCOUNTER — Other Ambulatory Visit: Payer: Self-pay | Admitting: Emergency Medicine

## 2023-12-10 NOTE — Telephone Encounter (Signed)
 Copied from CRM [id number]. Topic: Clinical - Medication Refill >> Dec 10, 2023 11:28 AM Lennart Pall wrote: Most Recent Primary Care Visit:  Provider: Georgina Quint  Department: [facility]  Visit Type: HOSPITAL FU  Date: 04/28/2023  Medication: oxybutynin (DITROPAN) 5 MG tablet  Has the patient contacted their pharmacy? Yes (Agent: If no, request that the patient contact the pharmacy for the refill. If patient does not wish to contact the pharmacy document the reason why and proceed with request.) (Agent: If yes, when and what did the pharmacy advise?)  Is this the correct pharmacy for this prescription? Yes If no, delete pharmacy and type the correct one.  This is the patient's preferred pharmacy:  [id number] [place], [place] - [address] AT [facility] OF [facility] [address] & [facility] [address] [place] [address] Phone: [phone] Fax: [phone]    Has the prescription been filled recently? Yes  Is the patient out of the medication? Yes  Has the patient been seen for an appointment in the last year OR does the patient have an upcoming appointment? Yes  Can we respond through MyChart? Yes  Agent: Please be advised that Rx refills may take up to 3 business days. We ask that you follow-up with your pharmacy.
